# Patient Record
Sex: Female | Born: 1945 | Race: Black or African American | Hispanic: No | Marital: Married | Smoking: Never smoker
Health system: Southern US, Community
[De-identification: ages and names within clinical notes are randomized; demographics above are authoritative.]

## PROBLEM LIST (undated history)

## (undated) DIAGNOSIS — K219 Gastro-esophageal reflux disease without esophagitis: Secondary | ICD-10-CM

## (undated) DIAGNOSIS — I10 Essential (primary) hypertension: Secondary | ICD-10-CM

## (undated) DIAGNOSIS — I2510 Atherosclerotic heart disease of native coronary artery without angina pectoris: Secondary | ICD-10-CM

## (undated) DIAGNOSIS — I493 Ventricular premature depolarization: Secondary | ICD-10-CM

## (undated) DIAGNOSIS — E785 Hyperlipidemia, unspecified: Secondary | ICD-10-CM

## (undated) HISTORY — PX: KNEE ARTHROSCOPY: SUR90

## (undated) HISTORY — PX: ABDOMINAL HYSTERECTOMY: SHX81

## (undated) SURGERY — LEFT HEART CATH AND CORONARY ANGIOGRAPHY
Anesthesia: Moderate Sedation

---

## 2004-06-27 ENCOUNTER — Ambulatory Visit: Payer: Self-pay | Admitting: Unknown Physician Specialty

## 2004-07-06 ENCOUNTER — Ambulatory Visit: Payer: Self-pay | Admitting: Endocrinology

## 2005-01-23 ENCOUNTER — Ambulatory Visit: Payer: Self-pay | Admitting: General Practice

## 2006-01-23 ENCOUNTER — Ambulatory Visit: Payer: Self-pay | Admitting: General Practice

## 2007-01-29 ENCOUNTER — Ambulatory Visit: Payer: Self-pay | Admitting: Endocrinology

## 2007-09-02 ENCOUNTER — Ambulatory Visit: Payer: Self-pay | Admitting: Gastroenterology

## 2008-03-10 ENCOUNTER — Ambulatory Visit: Payer: Self-pay | Admitting: Internal Medicine

## 2008-04-25 ENCOUNTER — Ambulatory Visit: Payer: Self-pay | Admitting: Unknown Physician Specialty

## 2009-05-26 ENCOUNTER — Ambulatory Visit: Payer: Self-pay | Admitting: Internal Medicine

## 2009-06-02 ENCOUNTER — Ambulatory Visit: Payer: Self-pay | Admitting: Internal Medicine

## 2010-06-09 ENCOUNTER — Ambulatory Visit: Payer: Self-pay | Admitting: Internal Medicine

## 2011-07-11 ENCOUNTER — Ambulatory Visit: Payer: Self-pay | Admitting: Internal Medicine

## 2011-08-15 HISTORY — PX: BREAST BIOPSY: SHX20

## 2012-06-24 ENCOUNTER — Emergency Department: Payer: Self-pay | Admitting: Emergency Medicine

## 2012-06-24 LAB — CBC
HCT: 36.3 % (ref 35.0–47.0)
HGB: 12 g/dL (ref 12.0–16.0)
MCH: 28.6 pg (ref 26.0–34.0)
MCHC: 33 g/dL (ref 32.0–36.0)
RDW: 14.2 % (ref 11.5–14.5)
WBC: 4.5 10*3/uL (ref 3.6–11.0)

## 2012-06-24 LAB — BASIC METABOLIC PANEL
Anion Gap: 7 (ref 7–16)
BUN: 9 mg/dL (ref 7–18)
Co2: 25 mmol/L (ref 21–32)
Creatinine: 0.92 mg/dL (ref 0.60–1.30)
EGFR (African American): >60
EGFR (Non-African Amer.): >60
Glucose: 105 mg/dL — ABNORMAL HIGH (ref 65–99)
Sodium: 140 mmol/L (ref 136–145)

## 2012-06-24 LAB — CK TOTAL AND CKMB (NOT AT ARMC): CK-MB: 3 ng/mL (ref 0.5–3.6)

## 2012-06-24 LAB — TROPONIN I: Troponin-I: <0.02 ng/mL

## 2012-07-15 ENCOUNTER — Ambulatory Visit: Payer: Self-pay | Admitting: Internal Medicine

## 2012-07-16 ENCOUNTER — Ambulatory Visit: Payer: Self-pay | Admitting: Internal Medicine

## 2012-07-22 ENCOUNTER — Ambulatory Visit: Payer: Self-pay | Admitting: Internal Medicine

## 2012-08-05 ENCOUNTER — Ambulatory Visit: Payer: Self-pay | Admitting: Surgery

## 2012-10-18 ENCOUNTER — Ambulatory Visit: Payer: Self-pay | Admitting: Gastroenterology

## 2013-01-14 ENCOUNTER — Ambulatory Visit: Payer: Self-pay | Admitting: Surgery

## 2013-02-05 ENCOUNTER — Ambulatory Visit: Payer: Self-pay | Admitting: Surgery

## 2014-01-14 ENCOUNTER — Ambulatory Visit: Payer: Self-pay | Admitting: Internal Medicine

## 2014-12-23 ENCOUNTER — Other Ambulatory Visit: Payer: Self-pay | Admitting: Internal Medicine

## 2014-12-23 DIAGNOSIS — Z1231 Encounter for screening mammogram for malignant neoplasm of breast: Secondary | ICD-10-CM

## 2015-01-21 ENCOUNTER — Ambulatory Visit
Admission: RE | Admit: 2015-01-21 | Discharge: 2015-01-21 | Disposition: A | Discharge status: Home / Self Care | Payer: Commercial Managed Care - HMO | Source: Ambulatory Visit | Attending: Internal Medicine | Admitting: Internal Medicine

## 2015-01-21 ENCOUNTER — Other Ambulatory Visit: Payer: Self-pay | Admitting: Internal Medicine

## 2015-01-21 DIAGNOSIS — Z1231 Encounter for screening mammogram for malignant neoplasm of breast: Secondary | ICD-10-CM | POA: Diagnosis not present

## 2015-12-08 ENCOUNTER — Other Ambulatory Visit: Payer: Self-pay | Admitting: Internal Medicine

## 2015-12-08 DIAGNOSIS — I6523 Occlusion and stenosis of bilateral carotid arteries: Secondary | ICD-10-CM

## 2015-12-15 ENCOUNTER — Ambulatory Visit
Admission: RE | Admit: 2015-12-15 | Discharge: 2015-12-15 | Disposition: A | Discharge status: Home / Self Care | Payer: Commercial Managed Care - HMO | Source: Ambulatory Visit | Attending: Internal Medicine | Admitting: Internal Medicine

## 2015-12-15 ENCOUNTER — Other Ambulatory Visit: Payer: Self-pay | Admitting: Internal Medicine

## 2015-12-15 DIAGNOSIS — I6523 Occlusion and stenosis of bilateral carotid arteries: Secondary | ICD-10-CM

## 2015-12-15 DIAGNOSIS — E785 Hyperlipidemia, unspecified: Secondary | ICD-10-CM | POA: Diagnosis present

## 2016-02-02 ENCOUNTER — Other Ambulatory Visit: Payer: Self-pay | Admitting: Internal Medicine

## 2016-02-02 DIAGNOSIS — Z1231 Encounter for screening mammogram for malignant neoplasm of breast: Secondary | ICD-10-CM

## 2016-02-22 ENCOUNTER — Ambulatory Visit: Payer: Commercial Managed Care - HMO

## 2016-03-07 ENCOUNTER — Ambulatory Visit: Payer: Commercial Managed Care - HMO

## 2016-03-28 ENCOUNTER — Ambulatory Visit
Admission: RE | Admit: 2016-03-28 | Discharge: 2016-03-28 | Disposition: A | Discharge status: Home / Self Care | Payer: Commercial Managed Care - HMO | Source: Ambulatory Visit | Attending: Internal Medicine | Admitting: Internal Medicine

## 2016-03-28 ENCOUNTER — Other Ambulatory Visit: Payer: Self-pay | Admitting: Internal Medicine

## 2016-03-28 DIAGNOSIS — Z1231 Encounter for screening mammogram for malignant neoplasm of breast: Secondary | ICD-10-CM

## 2016-12-09 ENCOUNTER — Encounter: Payer: Self-pay | Admitting: Emergency Medicine

## 2016-12-09 DIAGNOSIS — R1013 Epigastric pain: Secondary | ICD-10-CM | POA: Diagnosis not present

## 2016-12-09 DIAGNOSIS — I10 Essential (primary) hypertension: Secondary | ICD-10-CM | POA: Insufficient documentation

## 2016-12-09 LAB — URINALYSIS, COMPLETE (UACMP) WITH MICROSCOPIC
BACTERIA UA: NONE SEEN
BILIRUBIN URINE: NEGATIVE
Glucose, UA: NEGATIVE mg/dL
HGB URINE DIPSTICK: NEGATIVE
Ketones, ur: NEGATIVE mg/dL
NITRITE: NEGATIVE
PROTEIN: NEGATIVE mg/dL
Specific Gravity, Urine: 1.015 (ref 1.005–1.030)
pH: 7 (ref 5.0–8.0)

## 2016-12-09 LAB — COMPREHENSIVE METABOLIC PANEL
ALT: 22 U/L (ref 14–54)
ANION GAP: 8 (ref 5–15)
AST: 38 U/L (ref 15–41)
Albumin: 4.1 g/dL (ref 3.5–5.0)
Alkaline Phosphatase: 115 U/L (ref 38–126)
BILIRUBIN TOTAL: 0.6 mg/dL (ref 0.3–1.2)
BUN: 12 mg/dL (ref 6–20)
CO2: 28 mmol/L (ref 22–32)
Calcium: 9.1 mg/dL (ref 8.9–10.3)
Chloride: 101 mmol/L (ref 101–111)
Creatinine, Ser: 1.01 mg/dL — ABNORMAL HIGH (ref 0.44–1.00)
GFR calc Af Amer: >60 mL/min (ref >60)
GFR, EST NON AFRICAN AMERICAN: 54 mL/min — AB (ref >60)
GLUCOSE: 103 mg/dL — AB (ref 65–99)
POTASSIUM: 3.5 mmol/L (ref 3.5–5.1)
Sodium: 137 mmol/L (ref 135–145)
TOTAL PROTEIN: 7.9 g/dL (ref 6.5–8.1)

## 2016-12-09 LAB — CBC
HEMATOCRIT: 38.6 % (ref 35.0–47.0)
HEMOGLOBIN: 12.6 g/dL (ref 12.0–16.0)
MCH: 28.3 pg (ref 26.0–34.0)
MCHC: 32.7 g/dL (ref 32.0–36.0)
MCV: 86.7 fL (ref 80.0–100.0)
Platelets: 230 10*3/uL (ref 150–440)
RBC: 4.45 MIL/uL (ref 3.80–5.20)
RDW: 14.1 % (ref 11.5–14.5)
WBC: 4.7 10*3/uL (ref 3.6–11.0)

## 2016-12-09 LAB — LIPASE, BLOOD: Lipase: 19 U/L (ref 11–51)

## 2016-12-09 LAB — TROPONIN I

## 2016-12-09 NOTE — ED Triage Notes (Signed)
Pt ambulatory to triage in NAD, report epigastric pain today, described as pressure and bloating, denies n/v.

## 2016-12-10 ENCOUNTER — Emergency Department
Admission: EM | Admit: 2016-12-10 | Discharge: 2016-12-10 | Disposition: A | Discharge status: Home / Self Care | Payer: Medicare HMO | Attending: Emergency Medicine | Admitting: Emergency Medicine

## 2016-12-10 DIAGNOSIS — R1013 Epigastric pain: Secondary | ICD-10-CM

## 2016-12-10 DIAGNOSIS — I10 Essential (primary) hypertension: Secondary | ICD-10-CM

## 2016-12-10 MED ORDER — GI COCKTAIL ~~LOC~~
30.0000 mL | Freq: Once | ORAL | Status: AC
  Administered 2016-12-10: 30 mL via ORAL

## 2016-12-10 MED ORDER — GI COCKTAIL ~~LOC~~
ORAL | Status: AC
  Administered 2016-12-10: 30 mL via ORAL
  Filled 2016-12-10: qty 30

## 2016-12-10 NOTE — ED Provider Notes (Signed)
[facility] Emergency Department Provider Note  ____________________________________________   First MD Initiated Contact with Patient 12/10/16 [phone]     (approximate)  I have reviewed the triage vital signs and the nursing notes.   HISTORY  Chief Complaint Abdominal Pain    HPI Beth Lin is a 71 y.o. female with a generally unremarkable medical history except for "occasional" acid reflux/GERD who presents for evaluation of epigastric pressure and bloating since earlier today.  The symptoms started about 12 hours ago after eating lunch.  She says this happens to her sometimes and her primary care doctor, with whom she had a normal and reassuring annual checkup yesterday, has told her she should take a daily acid reflux pill.  However she does not do so because she does not have symptoms all the time.  She took a Protonix after developing the symptoms but she knows that it does not work immediately.  She describes the discomfort as pressure and bloating right at the top of her stomach below her rib cage.  She denies fever/chills, chest pain, shortness of breath, nausea, vomiting, lower abdominal pain, dysuria, constipation, diarrhea.  Nothing in particular makes symptoms better nor worse.  She is feeling better now but still has mild symptoms.     History reviewed. No pertinent past medical history.  There are no active problems to display for this patient.   Past Surgical History:  Procedure Laterality Date  . BREAST BIOPSY Right 2013   Negative    Prior to Admission medications   Not on File    Allergies Tetracyclines & related  Family History  Problem Relation Age of Onset  . Breast cancer Paternal Aunt 44    Social History Social History  Substance Use Topics  . Smoking status: Never Smoker  . Smokeless tobacco: Never Used  . Alcohol use No    Review of Systems Constitutional: No fever/chills Eyes: No visual changes. ENT: No  sore throat. Cardiovascular: Denies chest pain. Respiratory: Denies shortness of breath. Gastrointestinal: Epigastric bloating and pressure, no burning pain.  No nausea, no vomiting.  No diarrhea.  No constipation. Genitourinary: Negative for dysuria. Musculoskeletal: Negative for back pain. Integumentary: Negative for rash. Neurological: Negative for headaches, focal weakness or numbness.   ____________________________________________   PHYSICAL EXAM:  VITAL SIGNS: ED Triage Vitals  Enc Vitals Group     BP 12/09/16 2225 (!) 178/81     Pulse Rate 12/09/16 2225 (!) 54     Resp 12/09/16 2225 20     Temp --      Temp src --      SpO2 12/09/16 2225 98 %     Weight 12/09/16 1957 162 lb (73.5 kg)     Height 12/09/16 1957 4\' 11"  (1.499 m)     Head Circumference --      Peak Flow --      Pain Score 12/09/16 1956 8     Pain Loc --      Pain Edu? --      Excl. in [place]? --     Constitutional: Alert and oriented. Well appearing and in no acute distress. Eyes: Conjunctivae are normal. PERRL. EOMI. Head: Atraumatic. Nose: No congestion/rhinnorhea. Mouth/Throat: Mucous membranes are moist. Neck: No stridor.  No meningeal signs.   Cardiovascular: Normal rate, regular rhythm. Good peripheral circulation. Grossly normal heart sounds. Respiratory: Normal respiratory effort.  No retractions. Lungs CTAB. Gastrointestinal: Soft with very mild tenderness to palpation.  Negative Murphy's sign,  no lower abdominal tenderness to palpation, no rebound, no guarding Musculoskeletal: No lower extremity tenderness nor edema. No gross deformities of extremities. Neurologic:  Normal speech and language. No gross focal neurologic deficits are appreciated.  Skin:  Skin is warm, dry and intact. No rash noted. Psychiatric: Mood and affect are normal. Speech and behavior are normal.  ____________________________________________   LABS (all labs ordered are listed, but only abnormal results are  displayed)  Labs Reviewed  COMPREHENSIVE METABOLIC PANEL - Abnormal; Notable for the following:       Result Value   Glucose, Bld 103 (*)    Creatinine, Ser 1.01 (*)    GFR calc non Af Amer 54 (*)    All other components within normal limits  URINALYSIS, COMPLETE (UACMP) WITH MICROSCOPIC - Abnormal; Notable for the following:    Color, Urine YELLOW (*)    APPearance CLEAR (*)    Leukocytes, UA TRACE (*)    Squamous Epithelial / LPF 0-5 (*)    All other components within normal limits  LIPASE, BLOOD  CBC  TROPONIN I   ____________________________________________  EKG  ED ECG REPORT I, Lakishia Bourassa, the attending physician, personally viewed and interpreted this ECG.  Date: 12/09/2016 EKG Time: 19:56 Rate: 58 Rhythm: Borderline sinus bradycardia QRS Axis: normal Intervals: normal ST/T Wave abnormalities: inverted T wave in lead V2 Conduction Disturbances: none Narrative Interpretation: unremarkable  ____________________________________________  RADIOLOGY   No results found.  ____________________________________________   PROCEDURES  Critical Care performed: No   Procedure(s) performed:   Procedures   ____________________________________________   INITIAL IMPRESSION / ASSESSMENT AND PLAN / ED COURSE  Pertinent labs & imaging results that were available during my care of the patient were reviewed by me and considered in my medical decision making (see chart for details).  The patient is very well-appearing.  Her symptoms have improved significantly during the 4-5 hours that she has been awaiting a room.   Her labs are reassuring.  She does suffer from intermittent GERD.  I encouraged her to continue taking her PPI regularly and follow-up with Dr. Sabra Heck but there is no suggestion that she is suffering from ACS, PE, or other acute or emergent medical condition.  For comfort we will give her a GI cocktail.  She feels comfortable with the plan for outpatient  follow-up.  Clinical Course as of Dec 11 214  Sun Dec 10, 2016  0144 Patient feels better after the GI cocktail.  She is having some elevated blood pressure readings that are significantly elevated but she is completely asymptomatic.  She was seen by her primary care doctor yesterday and they did not discuss any blood pressure medicine.  I had my typical asymptomatic hypertension discussion with the patient and explained that she should follow-up as an outpatient but there was not an indication to start medication tonight.  She understands and agrees.  I gave my usual and customary return precautions.     [CF]    Clinical Course User Index [CF] Hinda Kehr, MD    ____________________________________________  FINAL CLINICAL IMPRESSION(S) / ED DIAGNOSES  Final diagnoses:  Epigastric pain  Essential hypertension     MEDICATIONS GIVEN DURING THIS VISIT:  Medications  gi cocktail (Maalox,Lidocaine,Donnatal) (30 mLs Oral Given 12/10/16 0107)     NEW OUTPATIENT MEDICATIONS STARTED DURING THIS VISIT:  There are no discharge medications for this patient.   There are no discharge medications for this patient.   There are no discharge medications for this patient.  Note:  This document was prepared using Dragon voice recognition software and may include unintentional dictation errors.    Hinda Kehr, MD 12/10/16 [phone]

## 2016-12-10 NOTE — Discharge Instructions (Signed)
We believe your symptoms are a result of GERD (acid reflux).  Please read through the included information and follow up with your regular doctor.  In the meantime, we encourage you to try an over-the-counter medication such as Prilosec OTC.  Give it at least a week at see if your symptoms improve.  As we discussed, though you do have high blood pressure (hypertension) tonight in the ED, fortunately it is not immediately dangerous at this time and does not need emergency intervention or admission to the hospital.  If we add to or change your regular medications, we may cause more harm than good - it is more appropriate for your primary care doctor to evaluate you in clinic and decide if any medication changes are needed.  Please follow up in clinic as recommended in these papers.    Return to the Emergency Department (ED) if you experience any worsening chest pain/pressure/tightness, difficulty breathing, or sudden sweating, or other symptoms that concern you.

## 2017-03-02 ENCOUNTER — Other Ambulatory Visit: Payer: Self-pay | Admitting: Internal Medicine

## 2017-03-02 DIAGNOSIS — Z1231 Encounter for screening mammogram for malignant neoplasm of breast: Secondary | ICD-10-CM

## 2017-03-29 ENCOUNTER — Ambulatory Visit
Admission: RE | Admit: 2017-03-29 | Discharge: 2017-03-29 | Disposition: A | Discharge status: Home / Self Care | Payer: Medicare HMO | Source: Ambulatory Visit | Attending: Internal Medicine | Admitting: Internal Medicine

## 2017-03-29 DIAGNOSIS — Z1231 Encounter for screening mammogram for malignant neoplasm of breast: Secondary | ICD-10-CM | POA: Diagnosis present

## 2017-05-08 IMAGING — MG MM DIGITAL SCREENING BILAT W/ TOMO W/ CAD
9 of 13 series · 9 of 29 positions shown · non-contrast
Comparison: Previous exam(s).

CLINICAL DATA: Screening.

EXAM:
2D DIGITAL SCREENING BILATERAL MAMMOGRAM WITH CAD AND ADJUNCT TOMO

[R CC (1 of 2)]
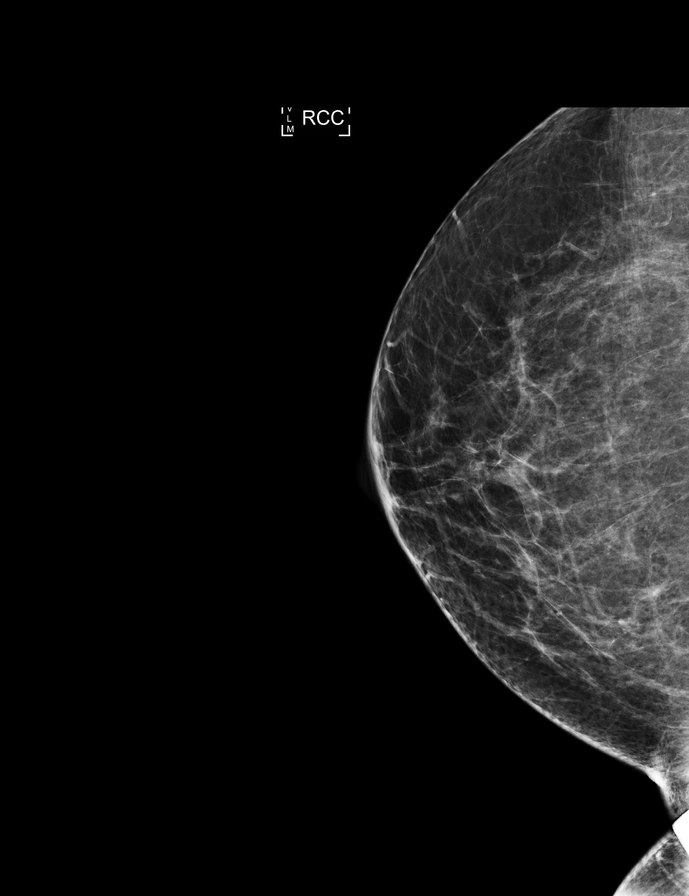

[R MLO]
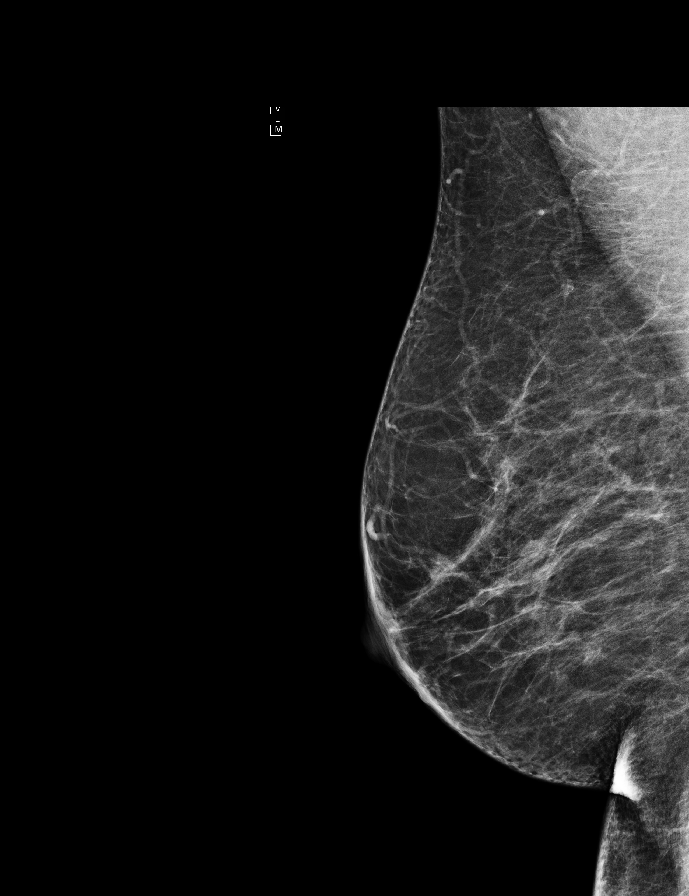

[L CC]
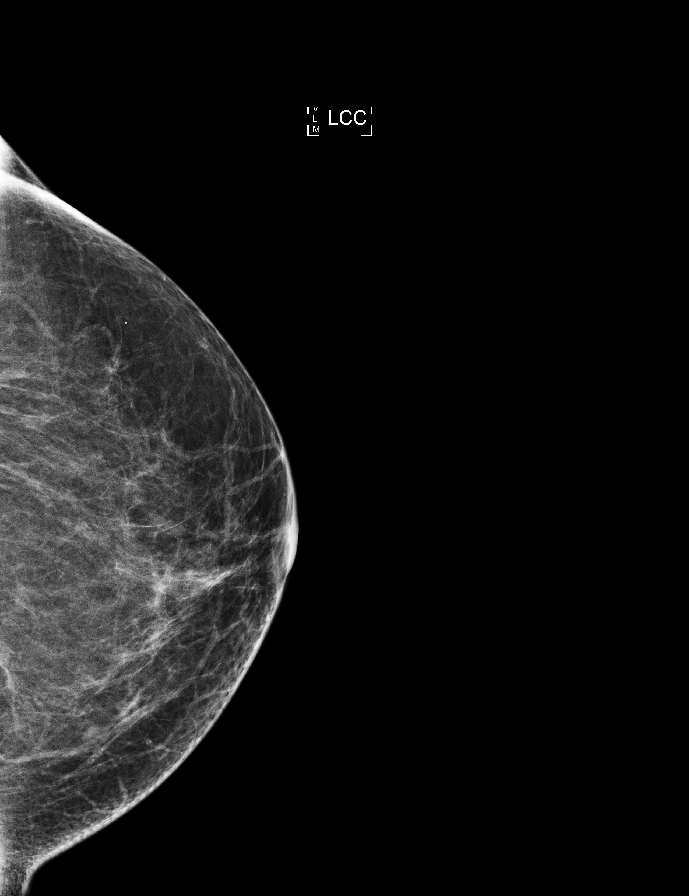

[L MLO]
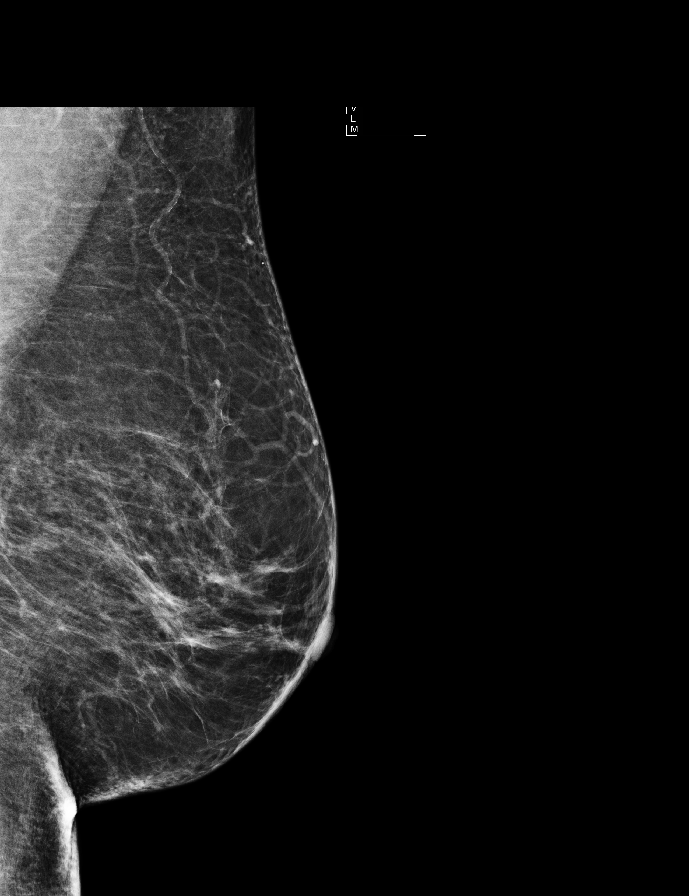

[L MLO synth-2D]
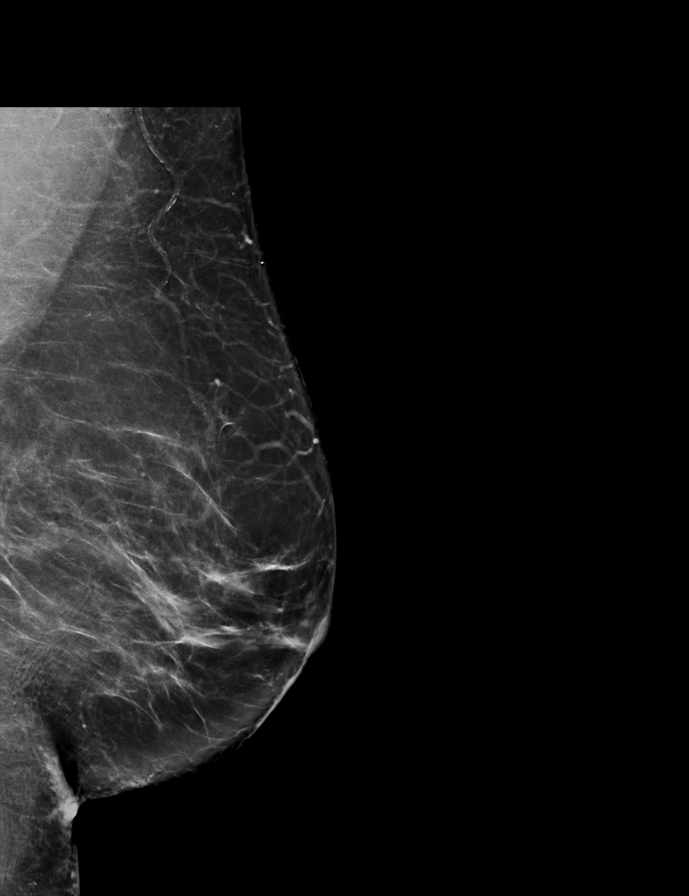

[L CC synth-2D]
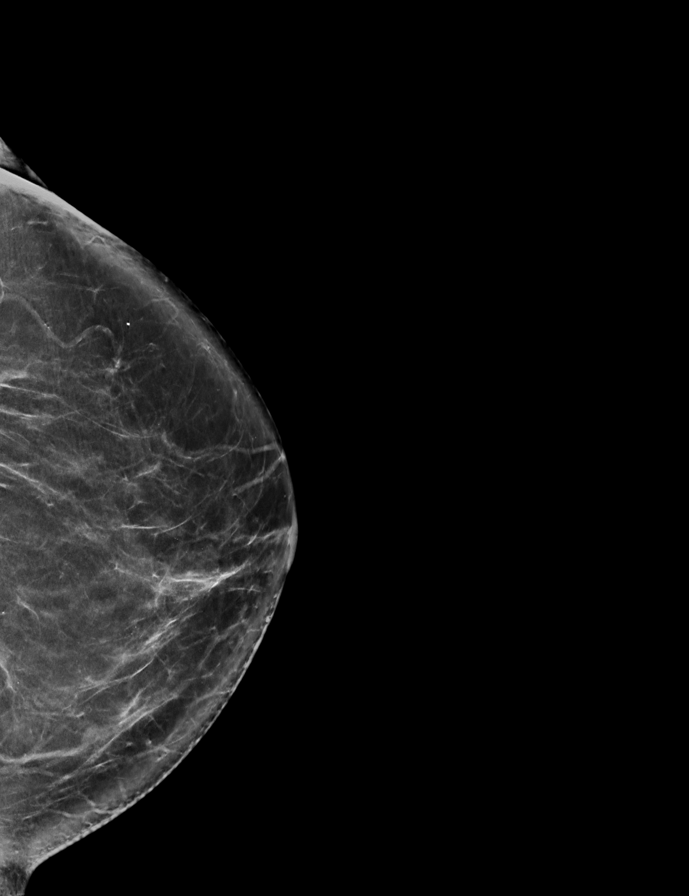

[R CC (2 of 2)]
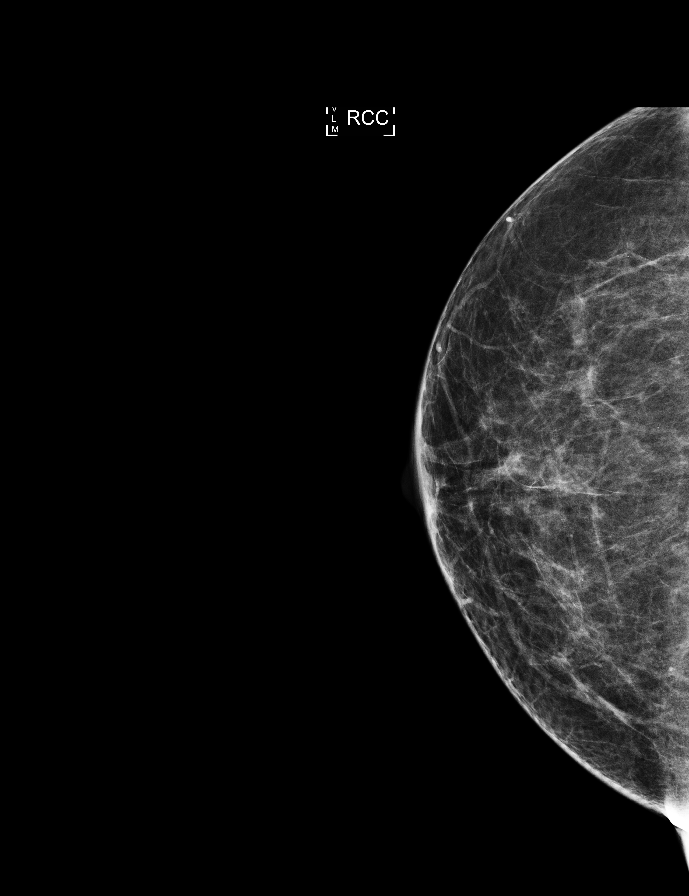

[R CC synth-2D]
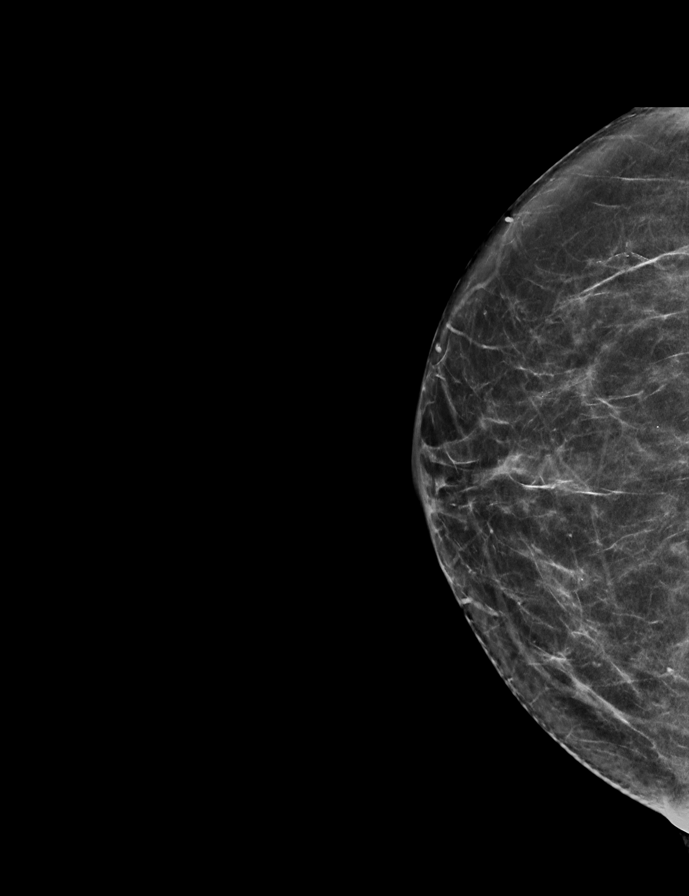

[R MLO synth-2D]
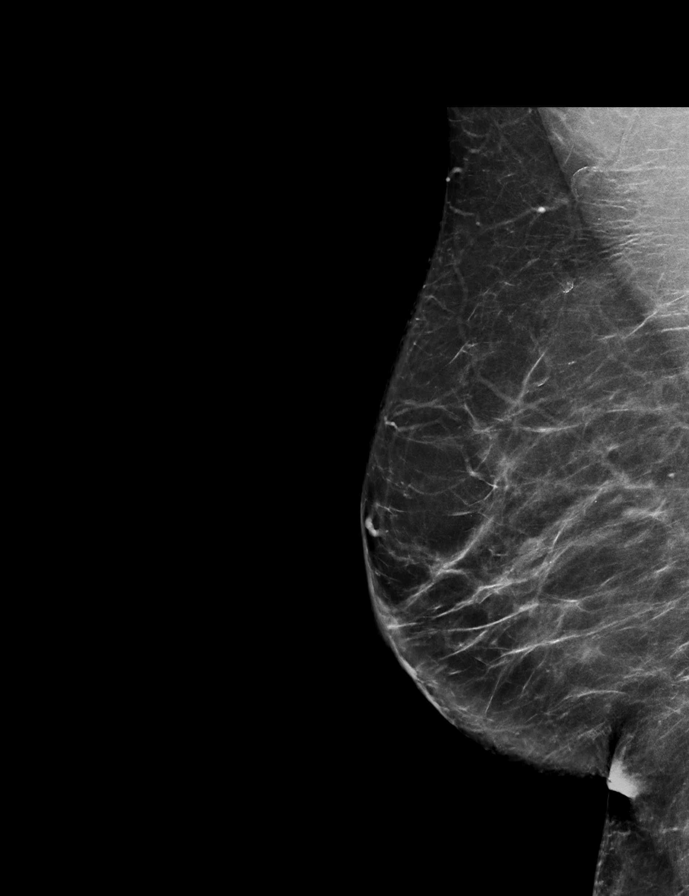

[9 of 29 positions shown; findings below may reference images not displayed]

ACR Breast Density Category b: There are scattered areas of
fibroglandular density.
FINDINGS: There are no findings suspicious for malignancy. Images were
processed with CAD.
IMPRESSION: No mammographic evidence of malignancy. A result letter of this
screening mammogram will be mailed directly to the patient.

RECOMMENDATION:
Screening mammogram in one year. (Code:97-6-RS4)

BI-RADS CATEGORY  1: Negative.

## 2017-12-10 ENCOUNTER — Inpatient Hospital Stay
Admission: EM | Admit: 2017-12-10 | Discharge: 2017-12-12 | DRG: 282 | Disposition: A | Discharge status: Home / Self Care | Payer: Medicare HMO | Attending: Internal Medicine | Admitting: Internal Medicine

## 2017-12-10 ENCOUNTER — Emergency Department: Payer: Medicare HMO

## 2017-12-10 ENCOUNTER — Other Ambulatory Visit: Payer: Self-pay

## 2017-12-10 ENCOUNTER — Encounter: Payer: Self-pay | Admitting: Emergency Medicine

## 2017-12-10 DIAGNOSIS — I2510 Atherosclerotic heart disease of native coronary artery without angina pectoris: Secondary | ICD-10-CM | POA: Diagnosis present

## 2017-12-10 DIAGNOSIS — I4510 Unspecified right bundle-branch block: Secondary | ICD-10-CM | POA: Diagnosis present

## 2017-12-10 DIAGNOSIS — Z79899 Other long term (current) drug therapy: Secondary | ICD-10-CM | POA: Diagnosis not present

## 2017-12-10 DIAGNOSIS — K219 Gastro-esophageal reflux disease without esophagitis: Secondary | ICD-10-CM | POA: Diagnosis present

## 2017-12-10 DIAGNOSIS — E785 Hyperlipidemia, unspecified: Secondary | ICD-10-CM | POA: Diagnosis present

## 2017-12-10 DIAGNOSIS — I10 Essential (primary) hypertension: Secondary | ICD-10-CM | POA: Diagnosis present

## 2017-12-10 DIAGNOSIS — I214 Non-ST elevation (NSTEMI) myocardial infarction: Principal | ICD-10-CM

## 2017-12-10 HISTORY — DX: Essential (primary) hypertension: I10

## 2017-12-10 HISTORY — DX: Gastro-esophageal reflux disease without esophagitis: K21.9

## 2017-12-10 LAB — COMPREHENSIVE METABOLIC PANEL
ALBUMIN: 4 g/dL (ref 3.5–5.0)
ALK PHOS: 97 U/L (ref 38–126)
ALT: 14 U/L (ref 14–54)
ANION GAP: 5 (ref 5–15)
AST: 29 U/L (ref 15–41)
BILIRUBIN TOTAL: 0.6 mg/dL (ref 0.3–1.2)
BUN: 10 mg/dL (ref 6–20)
CALCIUM: 8.8 mg/dL — AB (ref 8.9–10.3)
CO2: 28 mmol/L (ref 22–32)
Chloride: 105 mmol/L (ref 101–111)
Creatinine, Ser: 0.97 mg/dL (ref 0.44–1.00)
GFR calc Af Amer: >60 mL/min (ref >60)
GFR calc non Af Amer: 57 mL/min — ABNORMAL LOW (ref >60)
GLUCOSE: 115 mg/dL — AB (ref 65–99)
Potassium: 3.5 mmol/L (ref 3.5–5.1)
Sodium: 138 mmol/L (ref 135–145)
TOTAL PROTEIN: 7.9 g/dL (ref 6.5–8.1)

## 2017-12-10 LAB — CBC
HEMATOCRIT: 39.4 % (ref 35.0–47.0)
Hemoglobin: 13 g/dL (ref 12.0–16.0)
MCH: 29.1 pg (ref 26.0–34.0)
MCHC: 33 g/dL (ref 32.0–36.0)
MCV: 88.1 fL (ref 80.0–100.0)
Platelets: 248 10*3/uL (ref 150–440)
RBC: 4.47 MIL/uL (ref 3.80–5.20)
RDW: 14.2 % (ref 11.5–14.5)
WBC: 4.6 10*3/uL (ref 3.6–11.0)

## 2017-12-10 LAB — TROPONIN I
Troponin I: 0.84 ng/mL (ref <0.03)
Troponin I: 0.69 ng/mL (ref <0.03)
Troponin I: 0.69 ng/mL (ref <0.03)

## 2017-12-10 LAB — HEPARIN LEVEL (UNFRACTIONATED): Heparin Unfractionated: 0.41 IU/mL (ref 0.30–0.70)

## 2017-12-10 LAB — TSH: TSH: 3.391 u[IU]/mL (ref 0.350–4.500)

## 2017-12-10 LAB — PROTIME-INR
INR: 1
Prothrombin Time: 13.1 seconds (ref 11.4–15.2)

## 2017-12-10 LAB — APTT: APTT: 31 s (ref 24–36)

## 2017-12-10 MED ORDER — SODIUM CHLORIDE 0.9 % WEIGHT BASED INFUSION: 3.0000 mL/kg/h | INTRAVENOUS | Status: AC

## 2017-12-10 MED ORDER — SIMVASTATIN 20 MG PO TABS
40.0000 mg | ORAL_TABLET | Freq: Every day | ORAL | Status: DC
  Administered 2017-12-11: 40 mg via ORAL
  Filled 2017-12-10: qty 2

## 2017-12-10 MED ORDER — NITROGLYCERIN 0.4 MG SL SUBL
0.4000 mg | SUBLINGUAL_TABLET | SUBLINGUAL | Status: DC | PRN
  Administered 2017-12-11: 0.4 mg via SUBLINGUAL
  Filled 2017-12-10: qty 1

## 2017-12-10 MED ORDER — HEPARIN (PORCINE) IN NACL 100-0.45 UNIT/ML-% IJ SOLN
750.0000 [IU]/h | INTRAMUSCULAR | Status: DC
  Administered 2017-12-10: 750 [IU]/h via INTRAVENOUS
  Filled 2017-12-10: qty 250

## 2017-12-10 MED ORDER — ASPIRIN 81 MG PO CHEW: 324.0000 mg | CHEWABLE_TABLET | ORAL | Status: AC

## 2017-12-10 MED ORDER — ASPIRIN 300 MG RE SUPP: 300.0000 mg | RECTAL | Status: AC

## 2017-12-10 MED ORDER — SODIUM CHLORIDE 0.9% FLUSH: 3.0000 mL | INTRAVENOUS | Status: DC | PRN

## 2017-12-10 MED ORDER — ASPIRIN 81 MG PO CHEW
324.0000 mg | CHEWABLE_TABLET | Freq: Once | ORAL | Status: AC
  Administered 2017-12-10: 324 mg via ORAL
  Filled 2017-12-10: qty 4

## 2017-12-10 MED ORDER — ASPIRIN EC 325 MG PO TBEC
325.0000 mg | DELAYED_RELEASE_TABLET | Freq: Every day | ORAL | Status: DC
  Administered 2017-12-12: 325 mg via ORAL
  Filled 2017-12-10: qty 1

## 2017-12-10 MED ORDER — SODIUM CHLORIDE 0.9 % WEIGHT BASED INFUSION
1.0000 mL/kg/h | INTRAVENOUS | Status: DC
  Administered 2017-12-10 – 2017-12-11 (×2): 1 mL/kg/h via INTRAVENOUS

## 2017-12-10 MED ORDER — HEPARIN BOLUS VIA INFUSION
3700.0000 [IU] | Freq: Once | INTRAVENOUS | Status: AC
  Administered 2017-12-10: 3700 [IU] via INTRAVENOUS
  Filled 2017-12-10: qty 3700

## 2017-12-10 MED ORDER — PANTOPRAZOLE SODIUM 40 MG PO TBEC
40.0000 mg | DELAYED_RELEASE_TABLET | Freq: Every day | ORAL | Status: DC
  Administered 2017-12-10 – 2017-12-12 (×3): 40 mg via ORAL
  Filled 2017-12-10 (×3): qty 1

## 2017-12-10 MED ORDER — NITROGLYCERIN 2 % TD OINT
0.5000 [in_us] | TOPICAL_OINTMENT | Freq: Four times a day (QID) | TRANSDERMAL | Status: DC
  Administered 2017-12-11 (×2): 0.5 [in_us] via TOPICAL
  Filled 2017-12-10 (×2): qty 1

## 2017-12-10 MED ORDER — ASPIRIN EC 325 MG PO TBEC
325.0000 mg | DELAYED_RELEASE_TABLET | Freq: Every day | ORAL | Status: DC
  Administered 2017-12-11: 325 mg via ORAL
  Filled 2017-12-10: qty 1

## 2017-12-10 MED ORDER — ASPIRIN EC 81 MG PO TBEC: 243.0000 mg | DELAYED_RELEASE_TABLET | Freq: Once | ORAL | Status: DC

## 2017-12-10 MED ORDER — LISINOPRIL 5 MG PO TABS
2.5000 mg | ORAL_TABLET | Freq: Every day | ORAL | Status: DC
  Administered 2017-12-10 – 2017-12-12 (×3): 2.5 mg via ORAL
  Filled 2017-12-10 (×3): qty 1

## 2017-12-10 MED ORDER — ASPIRIN 81 MG PO CHEW
81.0000 mg | CHEWABLE_TABLET | ORAL | Status: AC
Start: 2017-12-11 — End: 2017-12-11
  Administered 2017-12-11: 81 mg via ORAL
  Filled 2017-12-10: qty 1

## 2017-12-10 MED ORDER — ALPRAZOLAM 0.25 MG PO TABS: 0.2500 mg | ORAL_TABLET | Freq: Two times a day (BID) | ORAL | Status: DC | PRN

## 2017-12-10 MED ORDER — HYDRALAZINE HCL 20 MG/ML IJ SOLN: 10.0000 mg | INTRAMUSCULAR | Status: DC | PRN

## 2017-12-10 MED ORDER — METOPROLOL TARTRATE 25 MG PO TABS
12.5000 mg | ORAL_TABLET | Freq: Two times a day (BID) | ORAL | Status: DC
  Administered 2017-12-10 – 2017-12-12 (×4): 12.5 mg via ORAL
  Filled 2017-12-10 (×4): qty 1

## 2017-12-10 MED ORDER — ONDANSETRON HCL 4 MG/2ML IJ SOLN
4.0000 mg | Freq: Four times a day (QID) | INTRAMUSCULAR | Status: DC | PRN
Start: 2017-12-10 — End: 2017-12-12
  Administered 2017-12-11: 4 mg via INTRAVENOUS

## 2017-12-10 MED ORDER — ACETAMINOPHEN 325 MG PO TABS
650.0000 mg | ORAL_TABLET | ORAL | Status: DC | PRN
  Filled 2017-12-10: qty 2

## 2017-12-10 MED ORDER — SODIUM CHLORIDE 0.9% FLUSH: 3.0000 mL | Freq: Two times a day (BID) | INTRAVENOUS | Status: DC

## 2017-12-10 MED ORDER — SODIUM CHLORIDE 0.9 % IV SOLN: 250.0000 mL | INTRAVENOUS | Status: DC | PRN

## 2017-12-10 MED ORDER — NITROGLYCERIN 2 % TD OINT: 0.5000 [in_us] | TOPICAL_OINTMENT | Freq: Four times a day (QID) | TRANSDERMAL | Status: DC

## 2017-12-10 NOTE — ED Provider Notes (Addendum)
[facility] Emergency Department Provider Note ____________________________________________   First MD Initiated Contact with Patient 12/10/17 1319     (approximate)  I have reviewed the triage vital signs and the nursing notes.   HISTORY  Chief Complaint Weakness    HPI Beth Lin is a 72 y.o. female with PMH as noted below as well as a history of GERD who presents with fatigue since yesterday, acute onset, occurring mainly with exertion, and associated with some pain to her mid back.  She denies chest pain, difficulty breathing, lightheadedness or weakness, or other acute symptoms.  She went to her primary care doctor's office today.  He noted that she had an abnormal EKG and referred her to the emergency department.  Patient states she has no prior cardiac history.  History reviewed. No pertinent past medical history.  There are no active problems to display for this patient.   Past Surgical History:  Procedure Laterality Date  . BREAST BIOPSY Right 2013   Negative    Prior to Admission medications   Not on File    Allergies Penicillins and Tetracyclines & related  Family History  Problem Relation Age of Onset  . Breast cancer Paternal Aunt 45    Social History Social History   Tobacco Use  . Smoking status: Never Smoker  . Smokeless tobacco: Never Used  Substance Use Topics  . Alcohol use: No  . Drug use: Not on file    Review of Systems  Constitutional: Positive for fatigue.  No fever. Eyes: No redness. ENT: No neck pain. Cardiovascular: Denies chest pain. Respiratory: Denies shortness of breath. Gastrointestinal: No nausea, no vomiting.  Genitourinary: Negative for flank pain.  Musculoskeletal: Positive for mild back pain. Skin: Negative for rash. Neurological: Negative for headache.   ____________________________________________   PHYSICAL EXAM:  VITAL SIGNS: ED Triage Vitals [12/10/17 1223]  Enc Vitals  Group     BP 124/81     Pulse Rate 74     Resp 18     Temp 98.3 F (36.8 C)     Temp Source Oral     SpO2 98 %     Weight 151 lb (68.5 kg)     Height 5\' 1"  (1.549 m)     Head Circumference      Peak Flow      Pain Score 0     Pain Loc      Pain Edu?      Excl. in [place]?     Constitutional: Alert and oriented. Well appearing and in no acute distress. Eyes: Conjunctivae are normal.  Head: Atraumatic. Nose: No congestion/rhinnorhea. Mouth/Throat: Mucous membranes are moist.   Neck: Normal range of motion.  Cardiovascular: Normal rate, regular rhythm. Grossly normal heart sounds.  Good peripheral circulation. Respiratory: Normal respiratory effort.  No retractions. Lungs CTAB. Gastrointestinal: No distention.  Genitourinary: No CVA tenderness. Musculoskeletal: No lower extremity edema.  Extremities warm and well perfused.  Neurologic:  Normal speech and language. No gross focal neurologic deficits are appreciated.  Skin:  Skin is warm and dry. No rash noted. Psychiatric: Mood and affect are normal. Speech and behavior are normal.  ____________________________________________   LABS (all labs ordered are listed, but only abnormal results are displayed)  Labs Reviewed  TROPONIN I - Abnormal; Notable for the following components:      Result Value   Troponin I 0.84 (*)    All other components within normal limits  COMPREHENSIVE METABOLIC PANEL -  Abnormal; Notable for the following components:   Glucose, Bld 115 (*)    Calcium 8.8 (*)    GFR calc non Af Amer 57 (*)    All other components within normal limits  CBC   ____________________________________________  EKG  ED ECG REPORT I, Arta Silence, the attending physician, personally viewed and interpreted this ECG.  Date: 12/10/2017 EKG Time: 1217 Rate: 75 Rhythm: normal sinus rhythm QRS Axis: normal Intervals: normal ST/T Wave abnormalities: Inferior and lateral T wave inversions Narrative Interpretation:  New inferior and lateral T wave inversions when compared to EKG of 12/10/2017, concerning for acute ischemia  ____________________________________________  RADIOLOGY  CXR: No focal infiltrate or other acute findings  ____________________________________________   PROCEDURES  Procedure(s) performed: No  Procedures  Critical Care performed: Yes  CRITICAL CARE Performed by: Arta Silence   Total critical care time: 20 minutes  Critical care time was exclusive of separately billable procedures and treating other patients.  Critical care was necessary to treat or prevent imminent or life-threatening deterioration.  Critical care was time spent personally by me on the following activities: development of treatment plan with patient and/or surrogate as well as nursing, discussions with consultants, evaluation of patient's response to treatment, examination of patient, obtaining history from patient or surrogate, ordering and performing treatments and interventions, ordering and review of laboratory studies, ordering and review of radiographic studies, pulse oximetry and re-evaluation of patient's condition.  ____________________________________________   INITIAL IMPRESSION / ASSESSMENT AND PLAN / ED COURSE  Pertinent labs & imaging results that were available during my care of the patient were reviewed by me and considered in my medical decision making (see chart for details).  72 year old female with PMH as noted above and no prior cardiac history presents with some fatigue and weakness since last night, with no chest pain or shortness of breath.  She went to her primary care doctor's office today and was noted to have new EKG abnormalities.  She was referred to the emergency department.  I reviewed the past medical records in epic; patient had an episode of epigastric discomfort almost exactly 1 year ago and was seen in the ED and had negative work-up.  On exam, she is  well-appearing, vital signs are normal, and the remainder of the exam is unremarkable.  EKG does show new inferior and lateral T wave inversions.  Labs obtained from triage reveal elevated troponin.  Presentation is concerning for acute ischemia.  Patient does not meet STEMI criteria.  I will consult cardiology, give aspirin, initiate heparin drip, and admit.    ----------------------------------------- 1:53 PM on 12/10/2017 -----------------------------------------  I consulted Dr. Saralyn Pilar from cardiology.  He agrees with the current plan of aspirin and heparin drip.  He will follow the patient.  I signed the patient out for admission to the hospitalist Dr. Jerelyn Charles.  ____________________________________________   FINAL CLINICAL IMPRESSION(S) / ED DIAGNOSES  Final diagnoses:  Non-STEMI (non-ST elevated myocardial infarction) ([place])      NEW MEDICATIONS STARTED DURING THIS VISIT:  New Prescriptions   No medications on file     Note:  This document was prepared using Dragon voice recognition software and may include unintentional dictation errors.   Arta Silence, MD 12/10/17 1354  Arta Silence, MD 12/26/17 1414

## 2017-12-10 NOTE — Progress Notes (Signed)
Attempted to call to receive report, RN was busy at the moment.

## 2017-12-10 NOTE — Consult Note (Signed)
[facility]  CARDIOLOGY CONSULT NOTE  Patient ID: Beth Lin MRN: [id number] DOB/AGE: Jan 07, 1946 72 y.o.  Admit date: 12/10/2017 Referring Physician Salary Primary Physician [facility] Primary Cardiologist  Reason for Consultation non-ST elevation myocardial infarction  HPI: 72 year old female referred for evaluation of non-ST elevation myocardial infarction.  The patient saw her primary care provider early today with apparent routine visit at which time she complained of generalized fatigue and left-sided chest and flank discomfort.  CT revealed new inferolateral ST inversions.  The patient was sent to [facility] emergency room initial troponin was less than 0.03.  Follow-up troponin was 0.84.  Review of systems complete and found to be negative unless listed above     History reviewed. No pertinent past medical history.  Past Surgical History:  Procedure Laterality Date  . ABDOMINAL HYSTERECTOMY    . BREAST BIOPSY Right 2013   Negative     (Not in a hospital admission) Social History   Socioeconomic History  . Marital status: Married    Spouse name: Not on file  . Number of children: Not on file  . Years of education: Not on file  . Highest education level: Not on file  Occupational History  . Not on file  Social Needs  . Financial resource strain: Not on file  . Food insecurity:    Worry: Not on file    Inability: Not on file  . Transportation needs:    Medical: Not on file    Non-medical: Not on file  Tobacco Use  . Smoking status: Never Smoker  . Smokeless tobacco: Never Used  Substance and Sexual Activity  . Alcohol use: No  . Drug use: Not on file  . Sexual activity: Not on file  Lifestyle  . Physical activity:    Days per week: Not on file    Minutes per session: Not on file  . Stress: Not on file  Relationships  . Social connections:    Talks on phone: Not on file    Gets together: Not on file    Attends religious service: Not on file    Active member  of club or organization: Not on file    Attends meetings of clubs or organizations: Not on file    Relationship status: Not on file  . Intimate partner violence:    Fear of current or ex partner: Not on file    Emotionally abused: Not on file    Physically abused: Not on file    Forced sexual activity: Not on file  Other Topics Concern  . Not on file  Social History Narrative  . Not on file    Family History  Problem Relation Age of Onset  . Breast cancer Paternal Aunt 61      Review of systems complete and found to be negative unless listed above      PHYSICAL EXAM  General: Well developed, well nourished, in no acute distress HEENT:  Normocephalic and atramatic Neck:  No JVD.  Lungs: Clear bilaterally to auscultation and percussion. Heart: HRRR . Normal S1 and S2 without gallops or murmurs.  Abdomen: Bowel sounds are positive, abdomen soft and non-tender  Msk:  Back normal, normal gait. Normal strength and tone for age. Extremities: No clubbing, cyanosis or edema.   Neuro: Alert and oriented X 3. Psych:  Good affect, responds appropriately  Labs:   Lab Results  Component Value Date   WBC 4.6 12/10/2017   HGB 13.0 12/10/2017   HCT 39.4  12/10/2017   MCV 88.1 12/10/2017   PLT 248 12/10/2017   Recent Labs  Lab 12/10/17 1225  NA 138  K 3.5  CL 105  CO2 28  BUN 10  CREATININE 0.97  CALCIUM 8.8*  PROT 7.9  BILITOT 0.6  ALKPHOS 97  ALT 14  AST 29  GLUCOSE 115*   Lab Results  Component Value Date   CKTOTAL 232 (H) 06/24/2012   CKMB 3.0 06/24/2012   TROPONINI 0.84 (HH) 12/10/2017   No results found for: CHOL No results found for: HDL No results found for: LDLCALC No results found for: TRIG No results found for: CHOLHDL No results found for: LDLDIRECT    Radiology: Dg Chest 2 View  Result Date: 12/10/2017 CLINICAL DATA:  Weakness EXAM: CHEST - 2 VIEW COMPARISON:  06/25/2012 FINDINGS: The heart size and mediastinal contours are within normal limits.  Both lungs are clear. The visualized skeletal structures are unremarkable. IMPRESSION: No active cardiopulmonary disease. Electronically Signed   By: Franchot Gallo M.D.   On: 12/10/2017 13:28    EKG: Normal sinus rhythm with inferolateral T wave inversions  ASSESSMENT AND PLAN:   1.  New onset chest pain, with atypical features, new ECG changes, with elevated troponin, consistent with non-ST elevation myocardial infarction  Recommendations  1.  Continue current medication 2.  Continue heparin drip 3.  Proceed with cardiac catheterization via right radial access with coronary angiography on 12/11/2017.  The risks, benefits alternatives of cardiac catheterization and potential percutaneous coronary intervention were explained to the patient and informed consent was obtained.  Signed: Isaias Cowman MD,PhD, [facility] 12/10/2017, 4:43 PM

## 2017-12-10 NOTE — Progress Notes (Signed)
[place] for Heparin Indication: chest pain/ACS  Allergies  Allergen Reactions  . Penicillins Swelling  . Tetracyclines & Related     Patient Measurements: Height: 5\' 1"  (154.9 cm) Weight: 151 lb (68.5 kg) IBW/kg (Calculated) : 47.8 Heparin Dosing Weight: 62 kg  Vital Signs: Temp: 98.3 F (36.8 C) (04/29 1223) Temp Source: Oral (04/29 1223) BP: 129/83 (04/29 1347) Pulse Rate: 71 (04/29 1347)  Labs: Recent Labs    12/10/17 1225  HGB 13.0  HCT 39.4  PLT 248  CREATININE 0.97  TROPONINI 0.84*    Estimated Creatinine Clearance: 45.7 mL/min (by C-G formula based on SCr of 0.97 mg/dL).   Medical History: History reviewed. No pertinent past medical history.  Assessment: 72 y/o F non on anticoagulants PTA per patient admitted with ACS.   Goal of Therapy:  Heparin level 0.3-0.7 units/ml Monitor platelets by anticoagulation protocol: Yes   Plan:  Give 3700 units bolus x 1 Start heparin infusion at 750 units/hr Check anti-Xa level in 8 hours and daily while on heparin Continue to monitor H&H and platelets  Ulice Dash D 12/10/2017,2:03 PM

## 2017-12-10 NOTE — ED Triage Notes (Signed)
Fatigue when gets up to walk since yesterday. Denies chest pain or SOB.

## 2017-12-10 NOTE — ED Notes (Signed)
Admitting MD at bedside.

## 2017-12-10 NOTE — ED Notes (Signed)
Attempted to call report

## 2017-12-10 NOTE — H&P (Signed)
[facility] [place] NAME: Beth Lin    MR#:  [id number]  DATE OF BIRTH:  03-23-1946  DATE OF ADMISSION:  12/10/2017  PRIMARY CARE PHYSICIAN: Rusty Aus, MD   REQUESTING/REFERRING PHYSICIAN:   CHIEF COMPLAINT:   Chief Complaint  Patient presents with  . Weakness    HISTORY OF PRESENT ILLNESS: Beth Lin  is a 72 y.o. female with a known history of GERD, hypertension, hyperlipidemia, presents with fatigue to starting on yesterday with walking/exertion, associated with mid back pain, weakness, seen by primary care provider earlier today-noted to have abnormal EKG, sent to the emergency room for further evaluation, ER work-up noted for troponin of 0.84, chest x-ray negative, EKG noted for T wave inversions inferiorly/laterally, right bundle branch block, case discussed between ED attending and cardiology/Dr. Josefa Half, hospitalist asked to admit, patient evaluated emergency room, husband at the bedside, patient in no apparent distress, resting comfortably in bed, patient now admitted for acute non-STEMI.  PAST MEDICAL HISTORY:   htn gerd hld  PAST SURGICAL HISTORY:  Past Surgical History:  Procedure Laterality Date  . BREAST BIOPSY Right 2013   Negative    SOCIAL HISTORY:  Social History   Tobacco Use  . Smoking status: Never Smoker  . Smokeless tobacco: Never Used  Substance Use Topics  . Alcohol use: No    FAMILY HISTORY:  Family History  Problem Relation Age of Onset  . Breast cancer Paternal Aunt 62    DRUG ALLERGIES:  Allergies  Allergen Reactions  . Penicillins Swelling  . Tetracyclines & Related     REVIEW OF SYSTEMS:   CONSTITUTIONAL: No fever, +fatigue, weakness with exertion.  EYES: No blurred or double vision.  EARS, NOSE, AND THROAT: No tinnitus or ear pain.  RESPIRATORY: No cough, shortness of breath, wheezing or hemoptysis.  CARDIOVASCULAR: No chest pain, orthopnea, edema.  GASTROINTESTINAL: No  nausea, vomiting, diarrhea or abdominal pain.  GENITOURINARY: No dysuria, hematuria.  ENDOCRINE: No polyuria, nocturia,  HEMATOLOGY: No anemia, easy bruising or bleeding SKIN: No rash or lesion. MUSCULOSKELETAL: No joint pain or arthritis. + Mid back pain NEUROLOGIC: No tingling, numbness, weakness.  PSYCHIATRY: No anxiety or depression.   MEDICATIONS AT HOME:  Prior to Admission medications   Medication Sig Start Date End Date Taking? Authorizing Provider  hydrochlorothiazide (HYDRODIURIL) 25 MG tablet Take 1 tablet by mouth daily as needed (blood pressure).    Yes [provider]  loratadine (CLARITIN) 10 MG tablet Take 10 mg by mouth daily.   Yes [provider]  montelukast (SINGULAIR) 10 MG tablet Take 1 tablet by mouth daily as needed (allergies or asthma).  12/08/16  Yes [provider]  pantoprazole (PROTONIX) 40 MG tablet Take 1 tablet by mouth daily. 12/08/16  Yes [provider]      PHYSICAL EXAMINATION:   VITAL SIGNS: Blood pressure 129/83, pulse 71, temperature 98.3 F (36.8 C), temperature source Oral, resp. rate 15, height 5\' 1"  (1.549 m), weight 68.5 kg (151 lb), SpO2 98 %.  GENERAL:  72 y.o.-year-old patient lying in the bed with no acute distress.  EYES: Pupils equal, round, reactive to light and accommodation. No scleral icterus. Extraocular muscles intact.  HEENT: Head atraumatic, normocephalic. Oropharynx and nasopharynx clear.  NECK:  Supple, no jugular venous distention. No thyroid enlargement, no tenderness.  LUNGS: Normal breath sounds bilaterally, no wheezing, rales,rhonchi or crepitation. No use of accessory muscles of respiration.  CARDIOVASCULAR: S1, S2 normal. No murmurs,  rubs, or gallops.  ABDOMEN: Soft, nontender, nondistended. Bowel sounds present. No organomegaly or mass.  EXTREMITIES: No pedal edema, cyanosis, or clubbing.  NEUROLOGIC: Cranial nerves II through XII are intact. Muscle strength 5/5 in all extremities.  Sensation intact. Gait not checked.  PSYCHIATRIC: The patient is alert and oriented x 3.  SKIN: No obvious rash, lesion, or ulcer.   LABORATORY PANEL:   CBC Recent Labs  Lab 12/10/17 1225  WBC 4.6  HGB 13.0  HCT 39.4  PLT 248  MCV 88.1  MCH 29.1  MCHC 33.0  RDW 14.2   ------------------------------------------------------------------------------------------------------------------  Chemistries  Recent Labs  Lab 12/10/17 1225  NA 138  K 3.5  CL 105  CO2 28  GLUCOSE 115*  BUN 10  CREATININE 0.97  CALCIUM 8.8*  AST 29  ALT 14  ALKPHOS 97  BILITOT 0.6   ------------------------------------------------------------------------------------------------------------------ estimated creatinine clearance is 45.7 mL/min (by C-G formula based on SCr of 0.97 mg/dL). ------------------------------------------------------------------------------------------------------------------ No results for input(s): TSH, T4TOTAL, T3FREE, THYROIDAB in the last 72 hours.  Invalid input(s): FREET3   Coagulation profile No results for input(s): INR, PROTIME in the last 168 hours. ------------------------------------------------------------------------------------------------------------------- No results for input(s): DDIMER in the last 72 hours. -------------------------------------------------------------------------------------------------------------------  Cardiac Enzymes Recent Labs  Lab 12/10/17 1225  TROPONINI 0.84*   ------------------------------------------------------------------------------------------------------------------ Invalid input(s): POCBNP  ---------------------------------------------------------------------------------------------------------------  Urinalysis    Component Value Date/Time   COLORURINE YELLOW (A) 12/09/2016 1958   APPEARANCEUR CLEAR (A) 12/09/2016 1958   LABSPEC 1.015 12/09/2016 1958   PHURINE 7.0 12/09/2016 1958   GLUCOSEU NEGATIVE  12/09/2016 1958   HGBUR NEGATIVE 12/09/2016 1958   BILIRUBINUR NEGATIVE 12/09/2016 1958   KETONESUR NEGATIVE 12/09/2016 1958   PROTEINUR NEGATIVE 12/09/2016 1958   NITRITE NEGATIVE 12/09/2016 1958   LEUKOCYTESUR TRACE (A) 12/09/2016 1958     RADIOLOGY: Dg Chest 2 View  Result Date: 12/10/2017 CLINICAL DATA:  Weakness EXAM: CHEST - 2 VIEW COMPARISON:  06/25/2012 FINDINGS: The heart size and mediastinal contours are within normal limits. Both lungs are clear. The visualized skeletal structures are unremarkable. IMPRESSION: No active cardiopulmonary disease. Electronically Signed   By: Franchot Gallo M.D.   On: 12/10/2017 13:28    EKG: Orders placed or performed during the hospital encounter of 12/10/17  . EKG 12-Lead  . EKG 12-Lead  . ED EKG within 10 minutes  . ED EKG within 10 minutes    IMPRESSION AND PLAN: *Acute non-STEMI *Chronic GERD without esophagitis *Chronic hyperlipidemia, unspecified *Chronic benign essential hypertension   Admit to telemetry floor on her ACS protocol, heparin drip for 48 hours, aspirin daily, Lopressor twice daily, lisinopril, schedule nitro paste to chest, supplemental oxygen as needed, IV morphine as needed breakthrough pain, echocardiogram, consult cardiology for expert opinion, continue to cycle cardiac enzymes, check lipids in the morning, PPI daily, vitals per routine, and continue close medical monitoring   All the records are reviewed and case discussed with ED provider. Management plans discussed with the patient, family and they are in agreement.  CODE STATUS:full Advance Directive Documentation     Most Recent Value  Type of Advance Directive  Healthcare Power of Attorney, Living will  Pre-existing out of facility DNR order (yellow form or pink MOST form)  -  "MOST" Form in Place?  -       TOTAL TIME TAKING CARE OF THIS PATIENT: 45 minutes.    Avel Peace Evaline Waltman M.D on 12/10/2017   Between 7am to 6pm - Pager -  [phone]  After 6pm  go to [email or web address] - password [email or web address] [facility]  Office  [phone]  CC: Primary care physician; Rusty Aus, MD   Note: This dictation was prepared with Dragon dictation along with smaller phrase technology. Any transcriptional errors that result from this process are unintentional.

## 2017-12-11 ENCOUNTER — Encounter
Admission: EM | Disposition: A | Discharge status: Home / Self Care | Payer: Self-pay | Source: Home / Self Care | Attending: Internal Medicine

## 2017-12-11 ENCOUNTER — Encounter: Payer: Self-pay | Admitting: *Deleted

## 2017-12-11 ENCOUNTER — Inpatient Hospital Stay
Admit: 2017-12-11 | Discharge: 2017-12-11 | Disposition: A | Discharge status: Home / Self Care | Payer: Medicare HMO | Attending: Family Medicine | Admitting: Family Medicine

## 2017-12-11 HISTORY — PX: LEFT HEART CATH AND CORONARY ANGIOGRAPHY: CATH118249

## 2017-12-11 LAB — LIPID PANEL
Cholesterol: 262 mg/dL — ABNORMAL HIGH (ref 0–200)
HDL: 51 mg/dL (ref >40)
LDL CALC: 191 mg/dL — AB (ref 0–99)
Total CHOL/HDL Ratio: 5.1 RATIO
Triglycerides: 102 mg/dL (ref <150)
VLDL: 20 mg/dL (ref 0–40)

## 2017-12-11 LAB — CBC
HCT: 39 % (ref 35.0–47.0)
Hemoglobin: 12.7 g/dL (ref 12.0–16.0)
MCH: 28.7 pg (ref 26.0–34.0)
MCHC: 32.5 g/dL (ref 32.0–36.0)
MCV: 88.4 fL (ref 80.0–100.0)
Platelets: 226 10*3/uL (ref 150–440)
RBC: 4.42 MIL/uL (ref 3.80–5.20)
RDW: 14.3 % (ref 11.5–14.5)
WBC: 4.9 10*3/uL (ref 3.6–11.0)

## 2017-12-11 LAB — HEPARIN LEVEL (UNFRACTIONATED): HEPARIN UNFRACTIONATED: 0.34 [IU]/mL (ref 0.30–0.70)

## 2017-12-11 SURGERY — LEFT HEART CATH AND CORONARY ANGIOGRAPHY
Anesthesia: Moderate Sedation

## 2017-12-11 MED ORDER — SODIUM CHLORIDE 0.9 % WEIGHT BASED INFUSION: 1.0000 mL/kg/h | INTRAVENOUS | Status: DC

## 2017-12-11 MED ORDER — FENTANYL CITRATE (PF) 100 MCG/2ML IJ SOLN
INTRAMUSCULAR | Status: AC
  Filled 2017-12-11: qty 2

## 2017-12-11 MED ORDER — OXYCODONE-ACETAMINOPHEN 5-325 MG PO TABS
1.0000 | ORAL_TABLET | Freq: Four times a day (QID) | ORAL | Status: DC | PRN
  Administered 2017-12-11: 1 via ORAL
  Filled 2017-12-11 (×2): qty 1

## 2017-12-11 MED ORDER — LIDOCAINE HCL (PF) 1 % IJ SOLN
INTRAMUSCULAR | Status: AC
  Filled 2017-12-11: qty 30

## 2017-12-11 MED ORDER — ATROPINE SULFATE 1 MG/10ML IJ SOSY
PREFILLED_SYRINGE | INTRAMUSCULAR | Status: AC
  Filled 2017-12-11: qty 10

## 2017-12-11 MED ORDER — ASPIRIN 81 MG PO CHEW: 81.0000 mg | CHEWABLE_TABLET | ORAL | Status: DC

## 2017-12-11 MED ORDER — VERAPAMIL HCL 2.5 MG/ML IV SOLN
INTRAVENOUS | Status: AC
Start: 2017-12-11 — End: 2017-12-11
  Filled 2017-12-11: qty 2

## 2017-12-11 MED ORDER — SODIUM CHLORIDE 0.9% FLUSH: 3.0000 mL | Freq: Two times a day (BID) | INTRAVENOUS | Status: DC

## 2017-12-11 MED ORDER — VERAPAMIL HCL 2.5 MG/ML IV SOLN
INTRAVENOUS | Status: AC
  Filled 2017-12-11: qty 2

## 2017-12-11 MED ORDER — SODIUM CHLORIDE 0.9 % WEIGHT BASED INFUSION
1.0000 mL/kg/h | INTRAVENOUS | Status: AC
  Administered 2017-12-11: 1 mL/kg/h via INTRAVENOUS

## 2017-12-11 MED ORDER — SODIUM CHLORIDE 0.9% FLUSH: 3.0000 mL | INTRAVENOUS | Status: DC | PRN

## 2017-12-11 MED ORDER — ONDANSETRON HCL 4 MG/2ML IJ SOLN
4.0000 mg | Freq: Four times a day (QID) | INTRAMUSCULAR | Status: DC | PRN
  Filled 2017-12-11: qty 2

## 2017-12-11 MED ORDER — ATROPINE SULFATE 1 MG/10ML IJ SOSY
PREFILLED_SYRINGE | INTRAMUSCULAR | Status: DC | PRN
  Administered 2017-12-11: 0.5 mg via INTRAVENOUS

## 2017-12-11 MED ORDER — MIDAZOLAM HCL 2 MG/2ML IJ SOLN
INTRAMUSCULAR | Status: AC
  Filled 2017-12-11: qty 2

## 2017-12-11 MED ORDER — SALINE SPRAY 0.65 % NA SOLN
1.0000 | NASAL | Status: DC | PRN
  Administered 2017-12-12: 1 via NASAL
  Filled 2017-12-11: qty 44

## 2017-12-11 MED ORDER — SODIUM CHLORIDE 0.9 % WEIGHT BASED INFUSION: 3.0000 mL/kg/h | INTRAVENOUS | Status: AC

## 2017-12-11 MED ORDER — SODIUM CHLORIDE 0.9 % IV SOLN: 250.0000 mL | INTRAVENOUS | Status: DC | PRN

## 2017-12-11 MED ORDER — HEPARIN (PORCINE) IN NACL 1000-0.9 UT/500ML-% IV SOLN
INTRAVENOUS | Status: AC
  Filled 2017-12-11: qty 1000

## 2017-12-11 MED ORDER — MIDAZOLAM HCL 2 MG/2ML IJ SOLN
INTRAMUSCULAR | Status: DC | PRN
  Administered 2017-12-11: 1 mg via INTRAVENOUS

## 2017-12-11 MED ORDER — OXYCODONE HCL 5 MG PO TABS
5.0000 mg | ORAL_TABLET | Freq: Four times a day (QID) | ORAL | Status: DC | PRN
  Administered 2017-12-11: 5 mg via ORAL
  Filled 2017-12-11 (×2): qty 1

## 2017-12-11 MED ORDER — IOPAMIDOL (ISOVUE-300) INJECTION 61%
INTRAVENOUS | Status: DC | PRN
  Administered 2017-12-11: 120 mL via INTRA_ARTERIAL

## 2017-12-11 MED ORDER — ACETAMINOPHEN 325 MG PO TABS
650.0000 mg | ORAL_TABLET | ORAL | Status: DC | PRN
  Administered 2017-12-11: 650 mg via ORAL

## 2017-12-11 MED ORDER — LIDOCAINE HCL (PF) 1 % IJ SOLN
INTRAMUSCULAR | Status: DC | PRN
  Administered 2017-12-11: 15 mL
  Administered 2017-12-11: 2 mL

## 2017-12-11 MED ORDER — SODIUM CHLORIDE 0.9% FLUSH
3.0000 mL | INTRAVENOUS | Status: DC | PRN
  Administered 2017-12-11: 3 mL via INTRAVENOUS
  Filled 2017-12-11: qty 3

## 2017-12-11 MED ORDER — FENTANYL CITRATE (PF) 100 MCG/2ML IJ SOLN
INTRAMUSCULAR | Status: DC | PRN
  Administered 2017-12-11: 25 ug via INTRAVENOUS

## 2017-12-11 MED ORDER — HEPARIN SODIUM (PORCINE) 1000 UNIT/ML IJ SOLN
INTRAMUSCULAR | Status: AC
  Filled 2017-12-11: qty 1

## 2017-12-11 SURGICAL SUPPLY — 12 items
CATH INFINITI 5FR ANG PIGTAIL (CATHETERS) ×3 IMPLANT
CATH INFINITI 5FR JL4 (CATHETERS) ×3 IMPLANT
CATH INFINITI JR4 5F (CATHETERS) ×3 IMPLANT
DEVICE CLOSURE MYNXGRIP 5F (Vascular Products) ×3 IMPLANT
KIT MANI 3VAL PERCEP (MISCELLANEOUS) ×3 IMPLANT
NDL PERC 4X21 ACCESS (NEEDLE) ×3 IMPLANT
NEEDLE PERC 18GX7CM (NEEDLE) ×3 IMPLANT
PACK CARDIAC CATH (CUSTOM PROCEDURE TRAY) ×3 IMPLANT
SHEATH AVANTI 5FR X 11CM (SHEATH) ×3 IMPLANT
SHEATH RAIN RADIAL 21G 6FR (SHEATH) ×3 IMPLANT
WIRE GUIDERIGHT .035X150 (WIRE) ×3 IMPLANT
WIRE ROSEN-J .035X260CM (WIRE) IMPLANT

## 2017-12-11 NOTE — Progress Notes (Signed)
[facility] at [place] NAME: Beth Lin    MR#:  [id number]  DATE OF BIRTH:  November 25, 1945  SUBJECTIVE:  came in after feeling weak. She had abdominal EKG noted primary care physician's office. Patient denies any chest pain or shortness of breath. family the room.  REVIEW OF SYSTEMS:   Review of Systems  Constitutional: Negative for chills, fever and weight loss.  HENT: Negative for ear discharge, ear pain and nosebleeds.   Eyes: Negative for blurred vision, pain and discharge.  Respiratory: Negative for sputum production, shortness of breath, wheezing and stridor.   Cardiovascular: Negative for chest pain, palpitations, orthopnea and PND.  Gastrointestinal: Negative for abdominal pain, diarrhea, nausea and vomiting.  Genitourinary: Negative for frequency and urgency.  Musculoskeletal: Negative for back pain and joint pain.  Neurological: Negative for sensory change, speech change, focal weakness and weakness.  Psychiatric/Behavioral: Negative for depression and hallucinations. The patient is not nervous/anxious.    Tolerating Diet:npo Tolerating PT: ambulatory  DRUG ALLERGIES:   Allergies  Allergen Reactions  . Penicillins Swelling  . Tetracyclines & Related     VITALS:  Blood pressure 139/85, pulse 81, temperature 98 F (36.7 C), temperature source Oral, resp. rate (!) 21, height 5\' 1"  (1.549 m), weight 67.2 kg (148 lb 3.2 oz), SpO2 96 %.  PHYSICAL EXAMINATION:   Physical Exam  GENERAL:  72 y.o.-year-old patient lying in the bed with no acute distress.  EYES: Pupils equal, round, reactive to light and accommodation. No scleral icterus. Extraocular muscles intact.  HEENT: Head atraumatic, normocephalic. Oropharynx and nasopharynx clear.  NECK:  Supple, no jugular venous distention. No thyroid enlargement, no tenderness.  LUNGS: Normal breath sounds bilaterally, no wheezing, rales, rhonchi. No use of accessory muscles of  respiration.  CARDIOVASCULAR: S1, S2 normal. No murmurs, rubs, or gallops.  ABDOMEN: Soft, nontender, nondistended. Bowel sounds present. No organomegaly or mass.  EXTREMITIES: No cyanosis, clubbing or edema b/l.    NEUROLOGIC: Cranial nerves II through XII are intact. No focal Motor or sensory deficits b/l.   PSYCHIATRIC:  patient is alert and oriented x 3.  SKIN: No obvious rash, lesion, or ulcer.   LABORATORY PANEL:  CBC Recent Labs  Lab 12/11/17 0505  WBC 4.9  HGB 12.7  HCT 39.0  PLT 226    Chemistries  Recent Labs  Lab 12/10/17 1225  NA 138  K 3.5  CL 105  CO2 28  GLUCOSE 115*  BUN 10  CREATININE 0.97  CALCIUM 8.8*  AST 29  ALT 14  ALKPHOS 97  BILITOT 0.6   Cardiac Enzymes Recent Labs  Lab 12/10/17 2238  TROPONINI 0.69*   RADIOLOGY:  Dg Chest 2 View  Result Date: 12/10/2017 CLINICAL DATA:  Weakness EXAM: CHEST - 2 VIEW COMPARISON:  06/25/2012 FINDINGS: The heart size and mediastinal contours are within normal limits. Both lungs are clear. The visualized skeletal structures are unremarkable. IMPRESSION: No active cardiopulmonary disease. Electronically Signed   By: Franchot Gallo M.D.   On: 12/10/2017 13:28   ASSESSMENT AND PLAN:  Beth Lin  is a 72 y.o. female with a known history of GERD, hypertension, hyperlipidemia, presents with fatigue to starting on yesterday with walking/exertion, associated with mid back pain, weakness, seen by primary care provider earlier today-noted to have abnormal EKG, sent to the emergency room for further evaluation, ER work-up noted for troponin of 0.84, chest x-ray negative, EKG noted for T wave inversions inferiorly/laterally, right bundle branch block  *  Acute non-STEMI -admitted with abnormal EKG as outpatient along with weakness and elevated troponin -IV heparin drip, PRN Nitro, low-dose beta blockers and lisinopril. -She was seen by cardiology. Scheduled for heart catheterization today  *Chronic GERD without  esophagitis -cont ppi  *Chronic hyperlipidemia, unspecified  *Chronic benign essential hypertension Hold HcTZ On BB and lisinopril in the setting of NSTEMi   Case discussed with Care Management/Social Worker. Management plans discussed with the patient, family and they are in agreement.  CODE STATUS: full  DVT Prophylaxis: heparin drip  TOTAL TIME TAKING CARE OF THIS PATIENT: *30* minutes.  >50% time spent on counselling and coordination of care  POSSIBLE D/C IN *1-2* DAYS, DEPENDING ON CLINICAL CONDITION.  Note: This dictation was prepared with Dragon dictation along with smaller phrase technology. Any transcriptional errors that result from this process are unintentional.  Fritzi Mandes M.D on 12/11/2017 at 2:18 PM  Between 7am to 6pm - Pager - [phone]  After 6pm go to [email or web address] - password [email or web address] [facility]  Office  [phone]  CC: Primary care physician; Rusty Aus, MDPatient ID: Beth Lin, female   DOB: [birth year], 72 y.o.   MRN: [id number]

## 2017-12-11 NOTE — Progress Notes (Addendum)
[place] for Heparin Indication: chest pain/ACS  Allergies  Allergen Reactions  . Penicillins Swelling  . Tetracyclines & Related     Patient Measurements: Height: 5\' 1"  (154.9 cm) Weight: 148 lb 3.2 oz (67.2 kg) IBW/kg (Calculated) : 47.8 Heparin Dosing Weight: 62 kg  Vital Signs: Temp: 98 F (36.7 C) (04/29 1945) Temp Source: Oral (04/29 1945) BP: 109/69 (04/30 0031) Pulse Rate: 84 (04/30 0031)  Labs: Recent Labs    12/10/17 1225 12/10/17 1414 12/10/17 1902 12/10/17 2238  HGB 13.0  --   --   --   HCT 39.4  --   --   --   PLT 248  --   --   --   APTT  --  31  --   --   LABPROT  --  13.1  --   --   INR  --  1.00  --   --   HEPARINUNFRC  --   --   --  0.41  CREATININE 0.97  --   --   --   TROPONINI 0.84*  --  0.69* 0.69*    Estimated Creatinine Clearance: 45.3 mL/min (by C-G formula based on SCr of 0.97 mg/dL).   Medical History: History reviewed. No pertinent past medical history.  Assessment: 72 y/o F non on anticoagulants PTA per patient admitted with ACS.   Goal of Therapy:  Heparin level 0.3-0.7 units/ml Monitor platelets by anticoagulation protocol: Yes   Plan:  Give 3700 units bolus x 1 Start heparin infusion at 750 units/hr Check anti-Xa level in 8 hours and daily while on heparin Continue to monitor H&H and platelets   4/29 2300 heparin level 0.41. Continue current regimen. Recheck heparin level and CBC with tomorrow AM labs..  4/30 AM heparin level 0.34. Continue current regimen. Recheck heparin level and CBC with tomorrow AM labs.  Beth Lin S 12/11/2017,12:43 AM

## 2017-12-11 NOTE — Care Management (Signed)
Admitted to [facility] with weakness /NSTEMI. Lives with husband, Tempie Donning [phone]. Sees Dr. [name] as primary care physician. Takes care of all basic and instrumental activities of daily living herself.  Scheduled for Cardiac Catherization today. Troponin on admission = 0.69.  No cardiac history prior to this admission Shelbie Ammons RN MSN CCM care Management [phone]

## 2017-12-12 LAB — CBC
HEMATOCRIT: 34.5 % — AB (ref 35.0–47.0)
HEMOGLOBIN: 11.4 g/dL — AB (ref 12.0–16.0)
MCH: 29.1 pg (ref 26.0–34.0)
MCHC: 33.1 g/dL (ref 32.0–36.0)
MCV: 87.8 fL (ref 80.0–100.0)
Platelets: 219 10*3/uL (ref 150–440)
RBC: 3.93 MIL/uL (ref 3.80–5.20)
RDW: 14.2 % (ref 11.5–14.5)
WBC: 5.2 10*3/uL (ref 3.6–11.0)

## 2017-12-12 LAB — ECHOCARDIOGRAM COMPLETE
HEIGHTINCHES: 61 in
WEIGHTICAEL: 2371.2 [oz_av]

## 2017-12-12 LAB — HEPARIN LEVEL (UNFRACTIONATED)

## 2017-12-12 MED ORDER — LISINOPRIL 2.5 MG PO TABS: 2.5000 mg | ORAL_TABLET | Freq: Every day | ORAL | 1 refills | Status: AC

## 2017-12-12 MED ORDER — METOPROLOL TARTRATE 25 MG PO TABS: 12.5000 mg | ORAL_TABLET | Freq: Two times a day (BID) | ORAL | 1 refills | Status: AC

## 2017-12-12 MED ORDER — SIMVASTATIN 40 MG PO TABS: 40.0000 mg | ORAL_TABLET | Freq: Every day | ORAL | 1 refills | Status: AC

## 2017-12-12 MED ORDER — ASPIRIN 325 MG PO TBEC: 325.0000 mg | DELAYED_RELEASE_TABLET | Freq: Every day | ORAL | 0 refills | Status: AC

## 2017-12-12 NOTE — Progress Notes (Signed)
[place] for Heparin Indication: chest pain/ACS  Allergies  Allergen Reactions  . Penicillins Swelling  . Tetracyclines & Related     Patient Measurements: Height: 5\' 1"  (154.9 cm) Weight: 148 lb 3.2 oz (67.2 kg) IBW/kg (Calculated) : 47.8 Heparin Dosing Weight: 62 kg  Vital Signs: Temp: 97.9 F (36.6 C) (05/01 0410) Temp Source: Oral (04/30 2002) BP: 114/71 (05/01 0410) Pulse Rate: 62 (05/01 0410)  Labs: Recent Labs    12/10/17 1225 12/10/17 1414 12/10/17 1902 12/10/17 2238 12/11/17 0505 12/12/17 0516  HGB 13.0  --   --   --  12.7 11.4*  HCT 39.4  --   --   --  39.0 34.5*  PLT 248  --   --   --  226 219  APTT  --  31  --   --   --   --   LABPROT  --  13.1  --   --   --   --   INR  --  1.00  --   --   --   --   HEPARINUNFRC  --   --   --  0.41 0.34 <0.10*  CREATININE 0.97  --   --   --   --   --   TROPONINI 0.84*  --  0.69* 0.69*  --   --     Estimated Creatinine Clearance: 45.3 mL/min (by C-G formula based on SCr of 0.97 mg/dL).   Medical History: Past Medical History:  Diagnosis Date  . GERD (gastroesophageal reflux disease)   . Hypertension     Assessment: 72 y/o F non on anticoagulants PTA per patient admitted with ACS.   Goal of Therapy:  Heparin level 0.3-0.7 units/ml Monitor platelets by anticoagulation protocol: Yes   Plan:  Give 3700 units bolus x 1 Start heparin infusion at 750 units/hr Check anti-Xa level in 8 hours and daily while on heparin Continue to monitor H&H and platelets   4/29 2300 heparin level 0.41. Continue current regimen. Recheck heparin level and CBC with tomorrow AM labs..  4/30 AM heparin level 0.34. Continue current regimen. Recheck heparin level and CBC with tomorrow AM labs.  5/1 AM heparin level <0.1. Heparin off per RN but order not d/c. Requested that RN ask MD to d/c heparin orders at rounding.  Donnae Michels S 12/12/2017,6:46 AM

## 2017-12-12 NOTE — Discharge Summary (Signed)
[place] at [facility]   PATIENT NAME: Beth Lin    MR#:  [id number]  DATE OF BIRTH:  August 29, 1945  DATE OF ADMISSION:  12/10/2017 ADMITTING PHYSICIAN: Gorden Harms, MD  DATE OF DISCHARGE: 12/12/2017  PRIMARY CARE PHYSICIAN: Rusty Aus, MD    ADMISSION DIAGNOSIS:  Non-STEMI (non-ST elevated myocardial infarction) ([place]) [I21.4]  DISCHARGE DIAGNOSIS:  NSTEMI s/p Cath  SECONDARY DIAGNOSIS:   Past Medical History:  Diagnosis Date  . GERD (gastroesophageal reflux disease)   . Hypertension     HOSPITAL COURSE:   Beth Lin a72 y.o.femalewith a known history of GERD, hypertension, hyperlipidemia, presents with fatigue to starting on yesterday with walking/exertion, associated with mid back pain, weakness, seen by primary care provider earlier today-noted to have abnormal EKG, sent to the emergency room for further evaluation, ER work-up noted for troponin of 0.84, chest x-ray negative, EKG noted for T wave inversions inferiorly/laterally, right bundle branch block  *Acute non-STEMI -admitted with abnormal EKG as outpatient along with weakness and elevated troponin -received IV heparin drip, PRN Nitro, low-dose beta blockers and lisinopril. -She was seen by cardiology. 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  Normal left ventricular function  *Chronic GERD without esophagitis -cont ppi  *Chronic hyperlipidemia, unspecified  *Chronic benign essential hypertension Hold HcTZ On BB and lisinopril in the setting of NSTEMi  Overall stable D/c home CONSULTS OBTAINED:  Treatment Team:  Isaias Cowman, MD  DRUG ALLERGIES:   Allergies  Allergen Reactions  . Penicillins Swelling  . Tetracyclines & Related     DISCHARGE MEDICATIONS:   Allergies as of 12/12/2017      Reactions   Penicillins Swelling   Tetracyclines & Related        Medication List    STOP taking these medications   hydrochlorothiazide 25 MG tablet Commonly known as:  HYDRODIURIL     TAKE these medications   aspirin 325 MG EC tablet Take 1 tablet (325 mg total) by mouth daily.   lisinopril 2.5 MG tablet Commonly known as:  PRINIVIL,ZESTRIL Take 1 tablet (2.5 mg total) by mouth at bedtime.   loratadine 10 MG tablet Commonly known as:  CLARITIN Take 10 mg by mouth daily.   metoprolol tartrate 25 MG tablet Commonly known as:  LOPRESSOR Take 0.5 tablets (12.5 mg total) by mouth 2 (two) times daily.   montelukast 10 MG tablet Commonly known as:  SINGULAIR Take 1 tablet by mouth daily as needed (allergies or asthma).   pantoprazole 40 MG tablet Commonly known as:  PROTONIX Take 1 tablet by mouth daily.   simvastatin 40 MG tablet Commonly known as:  ZOCOR Take 1 tablet (40 mg total) by mouth daily at 6 PM.       If you experience worsening of your admission symptoms, develop shortness of breath, life threatening emergency, suicidal or homicidal thoughts you must seek medical attention immediately by calling 911 or calling your MD immediately  if symptoms less severe.  You Must read complete instructions/literature along with all the possible adverse reactions/side effects for all the Medicines you take and that have been prescribed to you. Take any new Medicines after you have completely understood and accept all the possible adverse reactions/side effects.   Please note  You were cared for by a hospitalist during your hospital stay. If you have any questions about your discharge medications or the care you  received while you were in the hospital after you are discharged, you can call the unit and asked to speak with the hospitalist on call if the hospitalist that took care of you is not available. Once you are discharged, your primary care physician will handle any further medical issues. Please note that NO REFILLS for any  discharge medications will be authorized once you are discharged, as it is imperative that you return to your primary care physician (or establish a relationship with a primary care physician if you do not have one) for your aftercare needs so that they can reassess your need for medications and monitor your lab values. Today   SUBJECTIVE   Doing well. C/o allergies  VITAL SIGNS:  Blood pressure 110/65, pulse (!) 56, temperature 98.3 F (36.8 C), temperature source Oral, resp. rate 17, height 5\' 1"  (1.549 m), weight 67.2 kg (148 lb 3.2 oz), SpO2 98 %.  I/O:    Intake/Output Summary (Last 24 hours) at 12/12/2017 0812 Last data filed at 12/12/2017 0100 Gross per 24 hour  Intake 408 ml  Output -  Net 408 ml    PHYSICAL EXAMINATION:  GENERAL:  72 y.o.-year-old patient lying in the bed with no acute distress.  EYES: Pupils equal, round, reactive to light and accommodation. No scleral icterus. Extraocular muscles intact.  HEENT: Head atraumatic, normocephalic. Oropharynx and nasopharynx clear.  NECK:  Supple, no jugular venous distention. No thyroid enlargement, no tenderness.  LUNGS: Normal breath sounds bilaterally, no wheezing, rales,rhonchi or crepitation. No use of accessory muscles of respiration.  CARDIOVASCULAR: S1, S2 normal. No murmurs, rubs, or gallops.  ABDOMEN: Soft, non-tender, non-distended. Bowel sounds present. No organomegaly or mass.  EXTREMITIES: No pedal edema, cyanosis, or clubbing.  NEUROLOGIC: Cranial nerves II through XII are intact. Muscle strength 5/5 in all extremities. Sensation intact. Gait not checked.  PSYCHIATRIC: The patient is alert and oriented x 3.  SKIN: No obvious rash, lesion, or ulcer.   DATA REVIEW:   CBC  Recent Labs  Lab 12/12/17 0516  WBC 5.2  HGB 11.4*  HCT 34.5*  PLT 219    Chemistries  Recent Labs  Lab 12/10/17 1225  NA 138  K 3.5  CL 105  CO2 28  GLUCOSE 115*  BUN 10  CREATININE 0.97  CALCIUM 8.8*  AST 29  ALT 14   ALKPHOS 97  BILITOT 0.6    Microbiology Results   No results found for this or any previous visit (from the past 240 hour(s)).  RADIOLOGY:  Dg Chest 2 View  Result Date: 12/10/2017 CLINICAL DATA:  Weakness EXAM: CHEST - 2 VIEW COMPARISON:  06/25/2012 FINDINGS: The heart size and mediastinal contours are within normal limits. Both lungs are clear. The visualized skeletal structures are unremarkable. IMPRESSION: No active cardiopulmonary disease. Electronically Signed   By: Franchot Gallo M.D.   On: 12/10/2017 13:28     Management plans discussed with the patient, family and they are in agreement.  CODE STATUS:     Code Status Orders  (From admission, onward)        Start     Ordered   12/10/17 1835  Full code  Continuous     12/10/17 1834    Code Status History    This patient has a current code status but no historical code status.    Advance Directive Documentation     Most Recent Value  Type of Advance Directive  Healthcare Power of Attorney, Living will  Pre-existing out  of facility DNR order (yellow form or pink MOST form)  -  "MOST" Form in Place?  -      TOTAL TIME TAKING CARE OF THIS PATIENT: *40* minutes.    Fritzi Mandes M.D on 12/12/2017 at 8:12 AM  Between 7am to 6pm - Pager - [phone] After 6pm go to [email or web address] - password [email or web address] [facility]  Office  [phone]  CC: Primary care physician; Rusty Aus, MD

## 2017-12-12 NOTE — Progress Notes (Signed)
Cardiovascular and Pulmonary Nurse Navigator Note  Beth Lin a72 y.o.femalewith a known history of GERD, hypertension, hyperlipidemia, presents with fatigue to starting the day prior to presenting with walking/exertion, associated with mid back pain, weakness, seen by primary care provider earlier today-noted to have abnormal EKG, sent to the emergency room for further evaluation, ER work-up noted for troponin of 0.84, chest x-ray negative, EKG noted for T wave inversions inferiorly/laterally, right bundle branch block  *Acute non-STEMI -admitted with abnormal EKG as outpatient along with weakness and elevated troponin -low-dose beta blockers and lisinopril, statin -She was seen by cardiology. 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 Normal left ventricular function  *Chronic GERD without esophagitis  *Chronic hyperlipidemia, unspecified  *Chronic benign essential hypertension On BB and lisinopril in the setting of NSTEMI  Education:   Patient lying in the bed with HOB elevated 40 degrees.  Husband at bedside.  "Heart Attack Bouncing Back" booklet given and reviewed with patient and husband. Discussed the definition of CAD. Reviewed the location of CAD.   ? Discussed modifiable risk factors including controlling blood pressure, cholesterol, and blood sugar; following heart healthy diet; maintaining healthy weight; exercise; and smoking cessation, if applicable. ? ? Discussed cardiac medications including rationale for taking, mechanisms of action, and side effects. Stressed the importance of taking medications as prescribed.  ? Discussed emergency plan for heart attack symptoms. Patient verbalized understanding of need to call 911 and not to drive herself?or have her family drive?her?to the?ER if having cardiac symptoms / chest pain.  ? Heart healthy diet of low sodium, low fat, low  cholesterol heart healthy diet discussed. Information on diet provided.    Smoking Cessation - Patient is a NEVER smoker.    Exercise - Benefits of exercised discussed. Informed patient cardiologist has referred her to outpatient Cardiac Rehab. An overview of the program was provided.?Patient very familiar with Cardiac Rehab, as her husband completed the program.  Patient scheduled for Cardiac Rehab orientation on Thursday, Dec 20, 2017 at 11:30 a.m.    Patient and husband appreciative of the information.  ? Roanna Epley, RN, BSN, [facility]? [facility]  Cardiovascular &?Pulmonary Nurse Navigator  Direct Line: [phone]  Department Phone #: [phone] Fax: [phone]? Email Address: [email or web address] ?

## 2017-12-12 NOTE — Care Management (Signed)
No discharge needs identified by members of the care team 

## 2017-12-20 ENCOUNTER — Ambulatory Visit: Payer: Medicare HMO

## 2018-02-05 ENCOUNTER — Emergency Department: Payer: Medicare HMO

## 2018-02-05 ENCOUNTER — Other Ambulatory Visit: Payer: Self-pay

## 2018-02-05 ENCOUNTER — Emergency Department
Admission: EM | Admit: 2018-02-05 | Discharge: 2018-02-05 | Disposition: A | Discharge status: Home / Self Care | Payer: Medicare HMO | Attending: Emergency Medicine | Admitting: Emergency Medicine

## 2018-02-05 DIAGNOSIS — R079 Chest pain, unspecified: Secondary | ICD-10-CM | POA: Diagnosis present

## 2018-02-05 DIAGNOSIS — I10 Essential (primary) hypertension: Secondary | ICD-10-CM | POA: Diagnosis not present

## 2018-02-05 DIAGNOSIS — Z7982 Long term (current) use of aspirin: Secondary | ICD-10-CM | POA: Insufficient documentation

## 2018-02-05 DIAGNOSIS — Z79899 Other long term (current) drug therapy: Secondary | ICD-10-CM | POA: Insufficient documentation

## 2018-02-05 LAB — CBC
HCT: 37 % (ref 35.0–47.0)
HEMOGLOBIN: 12.4 g/dL (ref 12.0–16.0)
MCH: 29.4 pg (ref 26.0–34.0)
MCHC: 33.6 g/dL (ref 32.0–36.0)
MCV: 87.7 fL (ref 80.0–100.0)
Platelets: 221 10*3/uL (ref 150–440)
RBC: 4.22 MIL/uL (ref 3.80–5.20)
RDW: 14.4 % (ref 11.5–14.5)
WBC: 5.1 10*3/uL (ref 3.6–11.0)

## 2018-02-05 LAB — BASIC METABOLIC PANEL
Anion gap: 9 (ref 5–15)
BUN: 10 mg/dL (ref 8–23)
CO2: 27 mmol/L (ref 22–32)
Calcium: 9.3 mg/dL (ref 8.9–10.3)
Chloride: 102 mmol/L (ref 98–111)
Creatinine, Ser: 0.96 mg/dL (ref 0.44–1.00)
GFR, EST NON AFRICAN AMERICAN: 57 mL/min — AB (ref >60)
GLUCOSE: 109 mg/dL — AB (ref 70–99)
Potassium: 3.6 mmol/L (ref 3.5–5.1)
SODIUM: 138 mmol/L (ref 135–145)

## 2018-02-05 LAB — TROPONIN I

## 2018-02-05 NOTE — ED Triage Notes (Signed)
Pt triage via wheelchair. Pt reports she was awakened from sleep around 1am with epigastric pain that she describes as non radiating and burning. Pt denies n/v or shortness of breath with the pain but reports some diaphoresis. Pt reports she chewed 2 asa and took protonix. Pt also reports she took her HCTZ also. Pt talking in full and complete sentences with no difficulty at this time.

## 2018-02-05 NOTE — ED Provider Notes (Signed)
[facility] Emergency Department Provider Note  Time seen: 7:13 AM  I have reviewed the triage vital signs and the nursing notes.   HISTORY  Chief Complaint Chest Pain    HPI Beth Lin is a 72 y.o. female with a past medical history of gastric reflux, hypertension, presents to the emergency department for heartburn sensation.  According to the patient for the past few days she has been experiencing burning dull type sensation in the upper abdomen and lower chest.  Patient has a long history of gastric reflux, tried taking her gastric reflux medications for this last night and it did not improve it so she became concerned that it could be cardiac.  Took aspirin which she thinks might of made the symptoms little worse and then came to the hospital.  She states however upon arrival to the hospital all of her symptoms have resolved.  Denies any shortness of breath or vomiting at any point.  Denies diaphoresis.   Past Medical History:  Diagnosis Date  . GERD (gastroesophageal reflux disease)   . Hypertension     Patient Active Problem List   Diagnosis Date Noted  . NSTEMI (non-ST elevated myocardial infarction) ([place]) 12/10/2017    Past Surgical History:  Procedure Laterality Date  . ABDOMINAL HYSTERECTOMY    . BREAST BIOPSY Right 2013   Negative  . LEFT HEART CATH AND CORONARY ANGIOGRAPHY N/A 12/11/2017   Procedure: LEFT HEART CATH AND CORONARY ANGIOGRAPHY;  Surgeon: Isaias Cowman, MD;  Location: [place] [facility];  Service: Cardiovascular;  Laterality: N/A;    Prior to Admission medications   Medication Sig Start Date End Date Taking? Authorizing Provider  aspirin EC 325 MG EC tablet Take 1 tablet (325 mg total) by mouth daily. 12/12/17   Fritzi Mandes, MD  lisinopril (PRINIVIL,ZESTRIL) 2.5 MG tablet Take 1 tablet (2.5 mg total) by mouth at bedtime. 12/12/17   Fritzi Mandes, MD  loratadine (CLARITIN) 10 MG tablet Take 10 mg by mouth daily.     [provider]  metoprolol tartrate (LOPRESSOR) 25 MG tablet Take 0.5 tablets (12.5 mg total) by mouth 2 (two) times daily. 12/12/17   Fritzi Mandes, MD  montelukast (SINGULAIR) 10 MG tablet Take 1 tablet by mouth daily as needed (allergies or asthma).  12/08/16   [provider]  pantoprazole (PROTONIX) 40 MG tablet Take 1 tablet by mouth daily. 12/08/16   [provider]  simvastatin (ZOCOR) 40 MG tablet Take 1 tablet (40 mg total) by mouth daily at 6 PM. 12/12/17   Fritzi Mandes, MD    Allergies  Allergen Reactions  . Penicillins Swelling  . Tetracyclines & Related     Family History  Problem Relation Age of Onset  . Breast cancer Paternal Aunt 75    Social History Social History   Tobacco Use  . Smoking status: Never Smoker  . Smokeless tobacco: Never Used  Substance Use Topics  . Alcohol use: No  . Drug use: Never    Review of Systems Constitutional: Negative for fever. Cardiovascular: Lower chest burning/discomfort, now resolved Respiratory: Negative for shortness of breath. Gastrointestinal: Mild upper abdominal discomfort, resolved.  Negative for diarrhea. Genitourinary: Negative for urinary compaints Musculoskeletal: Negative for leg pain or swelling. Skin: Negative for skin complaints  Neurological: Negative for headache All other ROS negative  ____________________________________________   PHYSICAL EXAM:  VITAL SIGNS: ED Triage Vitals  Enc Vitals Group     BP 02/05/18 0207 (!) 147/78  Pulse Rate 02/05/18 0207 (!) 59     Resp 02/05/18 0207 18     Temp 02/05/18 0207 98.5 F (36.9 C)     Temp Source 02/05/18 0207 Oral     SpO2 02/05/18 0207 99 %     Weight 02/05/18 0208 146 lb (66.2 kg)     Height 02/05/18 0208 5\' 1"  (1.549 m)     Head Circumference --      Peak Flow --      Pain Score 02/05/18 0208 3     Pain Loc --      Pain Edu? --      Excl. in [place]? --    Constitutional: Alert and oriented. Well appearing and in no  distress. Eyes: Normal exam ENT   Head: Normocephalic and atraumatic.   Mouth/Throat: Mucous membranes are moist. Cardiovascular: Normal rate, regular rhythm. No murmur Respiratory: Normal respiratory effort without tachypnea nor retractions. Breath sounds are clear Gastrointestinal: Soft and nontender. No distention.   Musculoskeletal: Nontender with normal range of motion in all extremities. No lower extremity tenderness or edema. Neurologic:  Normal speech and language. No gross focal neurologic deficits Skin:  Skin is warm, dry and intact.  Psychiatric: Mood and affect are normal  ____________________________________________    EKG  EKG reviewed and interpreted by myself shows normal sinus rhythm at 62 bpm, narrow QRS, normal axis, normal intervals, nonspecific ST changes.  Unchanged to improved from prior EKG.  ____________________________________________    RADIOLOGY  Chest x-ray negative  ____________________________________________   INITIAL IMPRESSION / ASSESSMENT AND PLAN / ED COURSE  Pertinent labs & imaging results that were available during my care of the patient were reviewed by me and considered in my medical decision making (see chart for details).  Patient presents to the emergency department for upper abdominal/lower chest discomfort/burning.  Patient believes it is due to her reflux.  Differential would include gastric reflux, ACS, pancreatitis.  Patient states her discomfort is completely resolved.  Patient's labs are normal including negative troponin, chest x-ray and EKG are normal.  I discussed with the patient repeating a troponin as the patient is now been here over 3 hours.  Patient states she has been here all night and she would prefer to go home strongly believes this is due to her reflux.  Patient states she will return if symptoms recur otherwise she will follow-up with her doctor.  ____________________________________________   FINAL  CLINICAL IMPRESSION(S) / ED DIAGNOSES  Chest pain     Harvest Dark, MD 02/05/18 [phone]

## 2018-03-21 ENCOUNTER — Other Ambulatory Visit: Payer: Self-pay | Admitting: Internal Medicine

## 2018-03-21 DIAGNOSIS — Z1231 Encounter for screening mammogram for malignant neoplasm of breast: Secondary | ICD-10-CM

## 2018-04-22 ENCOUNTER — Ambulatory Visit
Admission: RE | Admit: 2018-04-22 | Discharge: 2018-04-22 | Disposition: A | Discharge status: Home / Self Care | Payer: Medicare HMO | Source: Ambulatory Visit | Attending: Internal Medicine | Admitting: Internal Medicine

## 2018-04-22 DIAGNOSIS — Z1231 Encounter for screening mammogram for malignant neoplasm of breast: Secondary | ICD-10-CM | POA: Diagnosis not present

## 2018-05-02 ENCOUNTER — Encounter
Admission: RE | Disposition: A | Discharge status: Home / Self Care | Payer: Self-pay | Source: Ambulatory Visit | Attending: Gastroenterology

## 2018-05-02 ENCOUNTER — Ambulatory Visit: Payer: Medicare HMO | Admitting: Anesthesiology

## 2018-05-02 ENCOUNTER — Ambulatory Visit
Admission: RE | Admit: 2018-05-02 | Discharge: 2018-05-02 | Disposition: A | Discharge status: Home / Self Care | Payer: Medicare HMO | Source: Ambulatory Visit | Attending: Gastroenterology | Admitting: Gastroenterology

## 2018-05-02 DIAGNOSIS — D120 Benign neoplasm of cecum: Secondary | ICD-10-CM | POA: Insufficient documentation

## 2018-05-02 DIAGNOSIS — Z86010 Personal history of colonic polyps: Secondary | ICD-10-CM | POA: Diagnosis present

## 2018-05-02 DIAGNOSIS — K635 Polyp of colon: Secondary | ICD-10-CM | POA: Diagnosis not present

## 2018-05-02 DIAGNOSIS — Z7982 Long term (current) use of aspirin: Secondary | ICD-10-CM | POA: Insufficient documentation

## 2018-05-02 DIAGNOSIS — I10 Essential (primary) hypertension: Secondary | ICD-10-CM | POA: Insufficient documentation

## 2018-05-02 DIAGNOSIS — E785 Hyperlipidemia, unspecified: Secondary | ICD-10-CM | POA: Diagnosis not present

## 2018-05-02 DIAGNOSIS — I2510 Atherosclerotic heart disease of native coronary artery without angina pectoris: Secondary | ICD-10-CM | POA: Insufficient documentation

## 2018-05-02 DIAGNOSIS — K219 Gastro-esophageal reflux disease without esophagitis: Secondary | ICD-10-CM | POA: Insufficient documentation

## 2018-05-02 DIAGNOSIS — Z79899 Other long term (current) drug therapy: Secondary | ICD-10-CM | POA: Insufficient documentation

## 2018-05-02 DIAGNOSIS — D123 Benign neoplasm of transverse colon: Secondary | ICD-10-CM | POA: Insufficient documentation

## 2018-05-02 HISTORY — DX: Ventricular premature depolarization: I49.3

## 2018-05-02 HISTORY — PX: COLONOSCOPY WITH PROPOFOL: SHX5780

## 2018-05-02 HISTORY — DX: Atherosclerotic heart disease of native coronary artery without angina pectoris: I25.10

## 2018-05-02 HISTORY — DX: Hyperlipidemia, unspecified: E78.5

## 2018-05-02 SURGERY — COLONOSCOPY WITH PROPOFOL
Anesthesia: General

## 2018-05-02 MED ORDER — LIDOCAINE HCL (PF) 2 % IJ SOLN
INTRAMUSCULAR | Status: DC | PRN
  Administered 2018-05-02: 60 mg via INTRADERMAL

## 2018-05-02 MED ORDER — PROPOFOL 10 MG/ML IV BOLUS
INTRAVENOUS | Status: AC
  Filled 2018-05-02: qty 20

## 2018-05-02 MED ORDER — PROPOFOL 500 MG/50ML IV EMUL
INTRAVENOUS | Status: DC | PRN
  Administered 2018-05-02: 75 ug/kg/min via INTRAVENOUS

## 2018-05-02 MED ORDER — PROPOFOL 500 MG/50ML IV EMUL
INTRAVENOUS | Status: AC
  Filled 2018-05-02: qty 50

## 2018-05-02 MED ORDER — SODIUM CHLORIDE 0.9 % IV SOLN
INTRAVENOUS | Status: DC
  Administered 2018-05-02: 1000 mL via INTRAVENOUS

## 2018-05-02 MED ORDER — PROPOFOL 10 MG/ML IV BOLUS
INTRAVENOUS | Status: DC | PRN
  Administered 2018-05-02: 50 mg via INTRAVENOUS

## 2018-05-02 NOTE — Anesthesia Post-op Follow-up Note (Signed)
Anesthesia QCDR form completed.        

## 2018-05-02 NOTE — Anesthesia Postprocedure Evaluation (Signed)
Anesthesia Post Note  Patient: Beth Lin  Procedure(s) Performed: COLONOSCOPY WITH PROPOFOL (N/A )  Patient location during evaluation: PACU Anesthesia Type: General Level of consciousness: awake and alert Pain management: pain level controlled Vital Signs Assessment: post-procedure vital signs reviewed and stable Respiratory status: spontaneous breathing, nonlabored ventilation and respiratory function stable Cardiovascular status: blood pressure returned to baseline and stable Postop Assessment: no apparent nausea or vomiting Anesthetic complications: no     Last Vitals:  Vitals:   05/02/18 0820 05/02/18 0830  BP: 128/74 129/61  Pulse: (!) 54 (!) 51  Resp: 17 12  Temp:    SpO2: 100% 100%    Last Pain:  Vitals:   05/02/18 0830  TempSrc:   PainSc: 0-No pain                 Durenda Hurt

## 2018-05-02 NOTE — H&P (Signed)
Outpatient short stay form Pre-procedure 05/02/2018 7:18 AM Beth Sails MD  Primary Physician: [name], MD  Reason for visit: She is a 72 year old female presenting today for colonoscopy.   History of present illness:  She has personal history of adenomatous colon polyps.  She tolerated prep well.  She does take a daily 81 mg aspirin she takes no other aspirin products or blood thinning agent.    Current Facility-Administered Medications:  .  0.9 %  sodium chloride infusion, , Intravenous, Continuous, Beth Sails, MD, Last Rate: 20 mL/hr at 05/02/18 0709, 1,000 mL at 05/02/18 0709  Medications Prior to Admission  Medication Sig Dispense Refill Last Dose  . aspirin EC 325 MG EC tablet Take 1 tablet (325 mg total) by mouth daily. 30 tablet 0 Past Week at Unknown time  . hydrochlorothiazide (HYDRODIURIL) 25 MG tablet Take 25 mg by mouth daily.   Past Week at Unknown time  . loratadine (CLARITIN) 10 MG tablet Take 10 mg by mouth daily.   Past Week at Unknown time  . montelukast (SINGULAIR) 10 MG tablet Take 1 tablet by mouth daily as needed (allergies or asthma).    Past Week at Unknown time  . pantoprazole (PROTONIX) 40 MG tablet Take 1 tablet by mouth daily.   Past Week at Unknown time  . pravastatin (PRAVACHOL) 20 MG tablet Take 20 mg by mouth daily.   Past Week at Unknown time  . simvastatin (ZOCOR) 40 MG tablet Take 1 tablet (40 mg total) by mouth daily at 6 PM. 30 tablet 1 Past Week at Unknown time  . lisinopril (PRINIVIL,ZESTRIL) 2.5 MG tablet Take 1 tablet (2.5 mg total) by mouth at bedtime. (Patient not taking: Reported on 05/01/2018) 30 tablet 1 Not Taking at Unknown time  . metoprolol tartrate (LOPRESSOR) 25 MG tablet Take 0.5 tablets (12.5 mg total) by mouth 2 (two) times daily. (Patient not taking: Reported on 05/01/2018) 60 tablet 1 Not Taking at Unknown time     Allergies  Allergen Reactions  . Lisinopril Cough  . Penicillins Swelling  . Simvastatin    unknown  . Tetracyclines & Related     unknown     Past Medical History:  Diagnosis Date  . Coronary artery disease   . GERD (gastroesophageal reflux disease)   . Hyperlipidemia   . Hypertension   . PVC (premature ventricular contraction)     Review of systems:      Physical Exam    Heart and lungs: Regular rate and rhythm without rub or gallop, lungs are bilaterally clear    HEENT: Cephalic atraumatic eyes are anicteric    Other:    Pertinant exam for procedure: Soft nontender nondistended bowel sounds positive normoactive    Planned proceedures: Colonoscopy and indicated procedures. I have discussed the risks benefits and complications of procedures to include not limited to bleeding, infection, perforation and the risk of sedation and the patient wishes to proceed.    Beth Sails, MD Gastroenterology 05/02/2018  7:18 AM

## 2018-05-02 NOTE — Transfer of Care (Signed)
Immediate Anesthesia Transfer of Care Note  Patient: Beth Lin [place]  Procedure(s) Performed: COLONOSCOPY WITH PROPOFOL (N/A )  Patient Location: PACU  Anesthesia Type:General  Level of Consciousness: awake  Airway & Oxygen Therapy: Patient Spontanous Breathing and Patient connected to nasal cannula oxygen  Post-op Assessment: Report given to RN  Post vital signs: Reviewed  Last Vitals:  Vitals Value Taken Time  BP 119/73 05/02/2018  8:00 AM  Temp 36 C 05/02/2018  8:00 AM  Pulse 68 05/02/2018  8:00 AM  Resp 18 05/02/2018  8:00 AM  SpO2 96 % 05/02/2018  8:00 AM    Last Pain:  Vitals:   05/02/18 0800  TempSrc: Tympanic  PainSc: Asleep         Complications: No apparent anesthesia complications

## 2018-05-02 NOTE — Op Note (Signed)
[facility] Gastroenterology Patient Name: Beth Lin Procedure Date: 05/02/2018 7:06 AM MRN: [id number] Account #: [id number] Date of Birth: [birth year] Admit Type: Outpatient Age: 72 Room: [facility] ENDO ROOM 1 Gender: Female Note Status: Finalized Procedure:            Colonoscopy Indications:          Personal history of colonic polyps Providers:            Lollie Sails, MD Referring MD:         Rusty Aus, MD (Referring MD) Complications:        No immediate complications. Procedure:            Pre-Anesthesia Assessment:                       - ASA Grade Assessment: II - A patient with mild                        systemic disease.                       After obtaining informed consent, the colonoscope was                        passed under direct vision. Throughout the procedure,                        the patient's blood pressure, pulse, and oxygen                        saturations were monitored continuously. The                        Colonoscope was introduced through the anus and                        advanced to the the cecum, identified by appendiceal                        orifice and ileocecal valve. The colonoscopy was                        performed without difficulty. The patient tolerated the                        procedure well. The quality of the bowel preparation                        was good. Findings:      A 2 mm polyp was found in the descending colon. The polyp was sessile.       The polyp was removed with a cold biopsy forceps. Resection and       retrieval were complete.      A 2 mm polyp was found in the transverse colon. The polyp was sessile.       The polyp was removed with a cold biopsy forceps. Resection and       retrieval were complete.      A 3 mm polyp was found in the cecum. The polyp was sessile. The polyp       was removed with a cold biopsy forceps. Resection and  retrieval were       complete.      The  digital rectal exam was normal.      The exam was otherwise without abnormality.      A few small-mouthed diverticula were found in the sigmoid colon. Impression:           - One 2 mm polyp in the descending colon, removed with                        a cold biopsy forceps. Resected and retrieved.                       - One 2 mm polyp in the transverse colon, removed with                        a cold biopsy forceps. Resected and retrieved.                       - One 3 mm polyp in the cecum, removed with a cold                        biopsy forceps. Resected and retrieved.                       - The examination was otherwise normal. Recommendation:       - Advance diet as tolerated. Procedure Code(s):    --- Professional ---                       [phone], Colonoscopy, flexible; with biopsy, single or                        multiple Diagnosis Code(s):    --- Professional ---                       D12.4, Benign neoplasm of descending colon                       D12.3, Benign neoplasm of transverse colon (hepatic                        flexure or splenic flexure)                       D12.0, Benign neoplasm of cecum                       Z86.010, Personal history of colonic polyps CPT copyright 2017 American Medical Association. All rights reserved. The codes documented in this report are preliminary and upon coder review may  be revised to meet current compliance requirements. Lollie Sails, MD 05/02/2018 8:01:36 AM This report has been signed electronically. Number of Addenda: 0 Note Initiated On: 05/02/2018 7:06 AM Scope Withdrawal Time: 0 hours 9 minutes 8 seconds  Total Procedure Duration: 0 hours 21 minutes 49 seconds       [facility]

## 2018-05-02 NOTE — Anesthesia Preprocedure Evaluation (Addendum)
Anesthesia Evaluation  Patient identified by MRN, date of birth, ID band Patient awake    Reviewed: Allergy & Precautions, H&P , NPO status , Patient's Chart, lab work & pertinent test results  Airway Mallampati: III  TM Distance: >3 FB Neck ROM: full    Dental  (+) Upper Dentures, Partial Lower   Pulmonary neg pulmonary ROS,    breath sounds clear to auscultation       Cardiovascular hypertension, + CAD and + Past MI   Rhythm:regular Rate:Normal     Neuro/Psych negative neurological ROS  negative psych ROS   GI/Hepatic Neg liver ROS, GERD  ,  Endo/Other  negative endocrine ROS  Renal/GU negative Renal ROS  negative genitourinary   Musculoskeletal   Abdominal   Peds  Hematology negative hematology ROS (+)   Anesthesia Other Findings Past Medical History: No date: Coronary artery disease No date: GERD (gastroesophageal reflux disease) No date: Hyperlipidemia No date: Hypertension No date: PVC (premature ventricular contraction)  Past Surgical History: No date: ABDOMINAL HYSTERECTOMY 2013: BREAST BIOPSY; Right     Comment:  Negative No date: KNEE ARTHROSCOPY 12/11/2017: LEFT HEART CATH AND CORONARY ANGIOGRAPHY; N/A     Comment:  Procedure: LEFT HEART CATH AND CORONARY ANGIOGRAPHY;                Surgeon: Isaias Cowman, MD;  Location: [place] [facility];  Service: Cardiovascular;  Laterality:               N/A;  BMI    Body Mass Index:  27.59 kg/m      Reproductive/Obstetrics negative OB ROS                            Anesthesia Physical Anesthesia Plan  ASA: II  Anesthesia Plan: General   Post-op Pain Management:    Induction:   PONV Risk Score and Plan: Propofol infusion and TIVA  Airway Management Planned: Natural Airway and Nasal Cannula  Additional Equipment:   Intra-op Plan:   Post-operative Plan:   Informed Consent: I have reviewed  the patients History and Physical, chart, labs and discussed the procedure including the risks, benefits and alternatives for the proposed anesthesia with the patient or authorized representative who has indicated his/her understanding and acceptance.   Dental Advisory Given  Plan Discussed with: Anesthesiologist, CRNA and Surgeon  Anesthesia Plan Comments:       Anesthesia Quick Evaluation

## 2018-05-03 ENCOUNTER — Encounter: Payer: Self-pay | Admitting: Gastroenterology

## 2018-05-04 LAB — SURGICAL PATHOLOGY

## 2019-04-11 ENCOUNTER — Other Ambulatory Visit: Payer: Self-pay | Admitting: Internal Medicine

## 2019-04-11 DIAGNOSIS — Z1231 Encounter for screening mammogram for malignant neoplasm of breast: Secondary | ICD-10-CM

## 2019-05-19 ENCOUNTER — Other Ambulatory Visit: Payer: Self-pay

## 2019-05-19 ENCOUNTER — Ambulatory Visit
Admission: RE | Admit: 2019-05-19 | Discharge: 2019-05-19 | Disposition: A | Discharge status: Home / Self Care | Payer: Medicare Other | Source: Ambulatory Visit | Attending: Internal Medicine | Admitting: Internal Medicine

## 2019-05-19 DIAGNOSIS — Z1231 Encounter for screening mammogram for malignant neoplasm of breast: Secondary | ICD-10-CM | POA: Insufficient documentation

## 2019-05-19 DIAGNOSIS — Z20822 Contact with and (suspected) exposure to covid-19: Secondary | ICD-10-CM

## 2019-05-19 DIAGNOSIS — Z20828 Contact with and (suspected) exposure to other viral communicable diseases: Secondary | ICD-10-CM

## 2019-05-21 LAB — NOVEL CORONAVIRUS, NAA: SARS-CoV-2, NAA: NOT DETECTED

## 2019-05-23 ENCOUNTER — Telehealth: Payer: Self-pay | Admitting: General Practice

## 2019-05-23 NOTE — Telephone Encounter (Signed)
Negative COVID results given. Patient results "NOT Detected." Caller expressed understanding. ° °

## 2020-12-23 ENCOUNTER — Other Ambulatory Visit: Payer: Self-pay | Admitting: Internal Medicine

## 2020-12-23 DIAGNOSIS — Z1231 Encounter for screening mammogram for malignant neoplasm of breast: Secondary | ICD-10-CM

## 2021-01-11 ENCOUNTER — Ambulatory Visit
Admission: RE | Admit: 2021-01-11 | Discharge: 2021-01-11 | Disposition: A | Discharge status: Home / Self Care | Payer: Medicare Other | Source: Ambulatory Visit | Attending: Internal Medicine | Admitting: Internal Medicine

## 2021-01-11 ENCOUNTER — Other Ambulatory Visit: Payer: Self-pay

## 2021-01-11 DIAGNOSIS — Z1231 Encounter for screening mammogram for malignant neoplasm of breast: Secondary | ICD-10-CM | POA: Diagnosis not present

## 2021-01-17 ENCOUNTER — Other Ambulatory Visit: Payer: Self-pay | Admitting: Internal Medicine

## 2021-01-17 DIAGNOSIS — R928 Other abnormal and inconclusive findings on diagnostic imaging of breast: Secondary | ICD-10-CM

## 2021-01-17 DIAGNOSIS — N6489 Other specified disorders of breast: Secondary | ICD-10-CM

## 2021-01-20 ENCOUNTER — Ambulatory Visit
Admission: RE | Admit: 2021-01-20 | Discharge: 2021-01-20 | Disposition: A | Discharge status: Home / Self Care | Payer: Medicare Other | Source: Ambulatory Visit | Attending: Internal Medicine | Admitting: Internal Medicine

## 2021-01-20 ENCOUNTER — Other Ambulatory Visit: Payer: Self-pay

## 2021-01-20 DIAGNOSIS — R928 Other abnormal and inconclusive findings on diagnostic imaging of breast: Secondary | ICD-10-CM

## 2021-01-20 DIAGNOSIS — N6489 Other specified disorders of breast: Secondary | ICD-10-CM

## 2022-03-02 IMAGING — US US BREAST*R* LIMITED INC AXILLA
1 series · 7 of 7 positions shown · non-contrast
Comparison: Previous exam(s).

CLINICAL DATA: Callback for RIGHT breast focal asymmetry

EXAM:
DIGITAL DIAGNOSTIC UNILATERAL RIGHT MAMMOGRAM WITH TOMOSYNTHESIS AND
CAD; ULTRASOUND RIGHT BREAST LIMITED
TECHNIQUE: Right digital diagnostic mammography and breast tomosynthesis was
performed. The images were evaluated with computer-aided detection.;
Targeted ultrasound examination of the right breast was performed

[Series 1: us breast*right* limited inc axilla · 0.07mm/px · 7 of 7 slices shown]
[im 1/7]
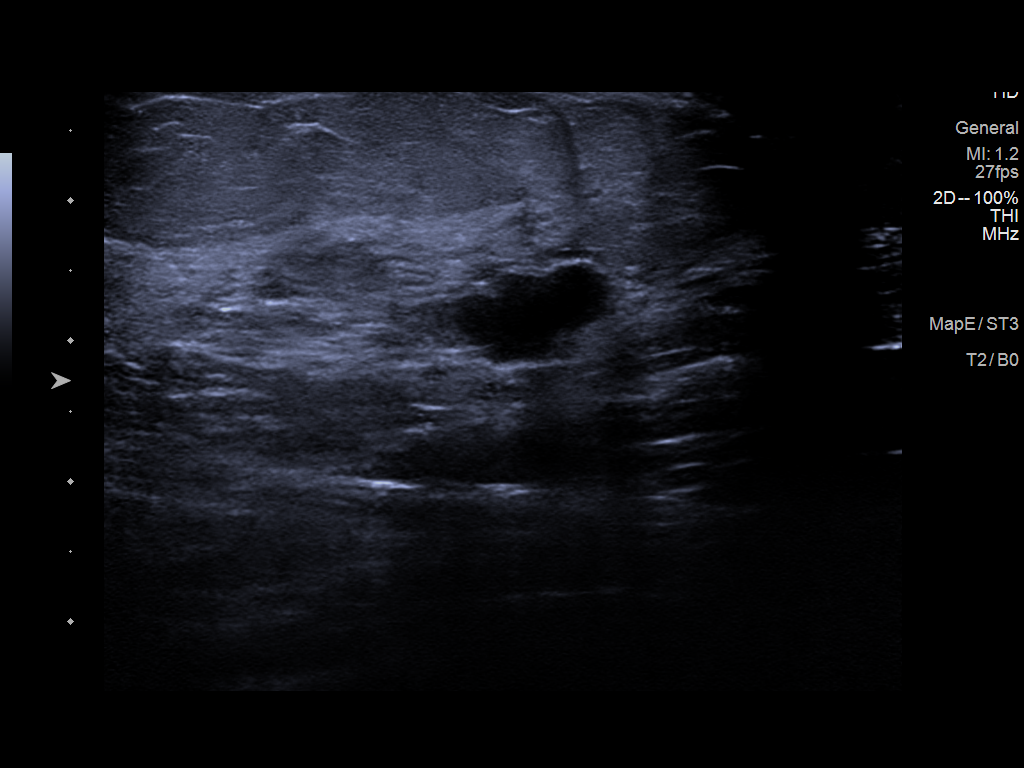
[im 2/7]
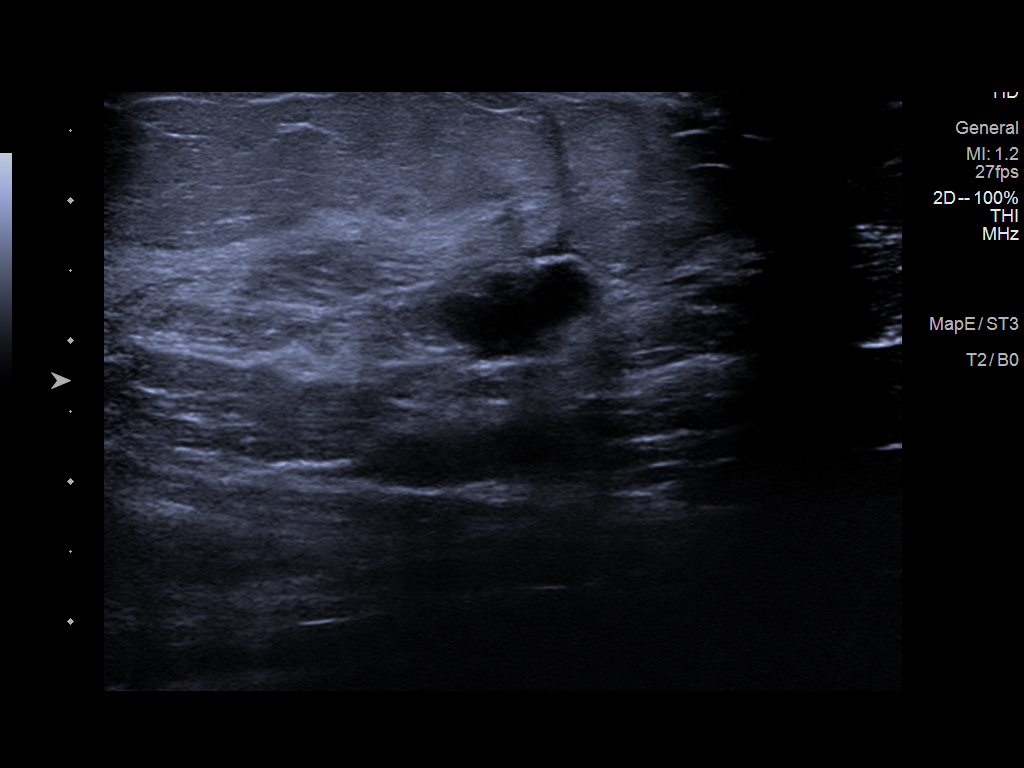
[im 3/7]
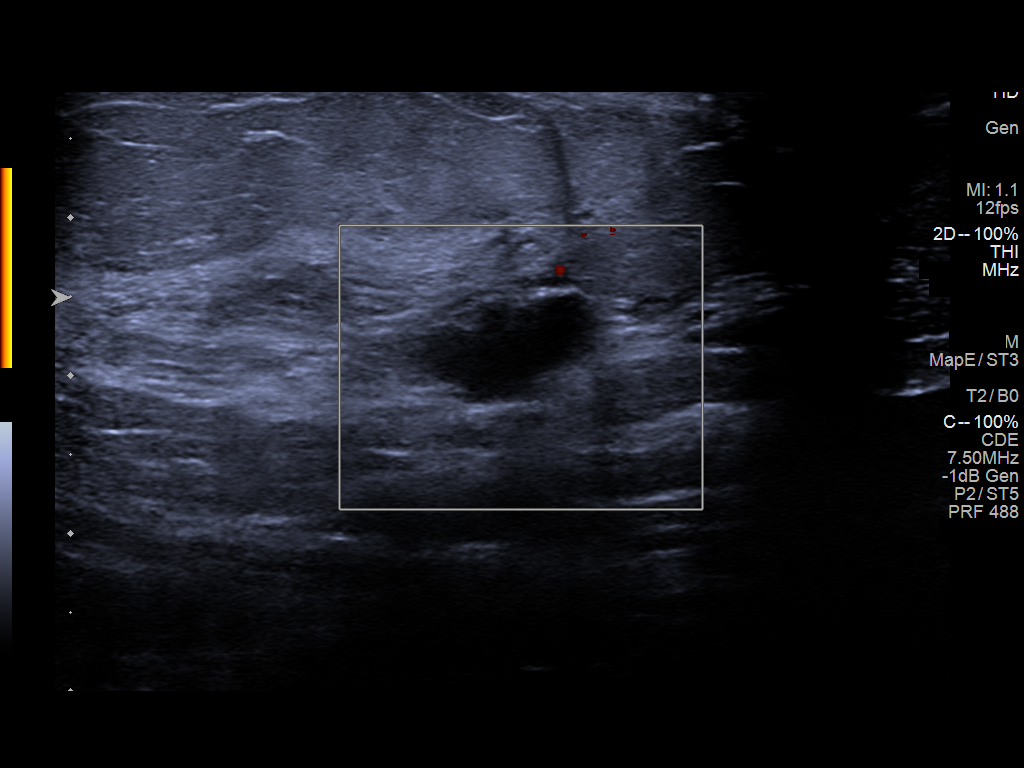
[im 4/7]
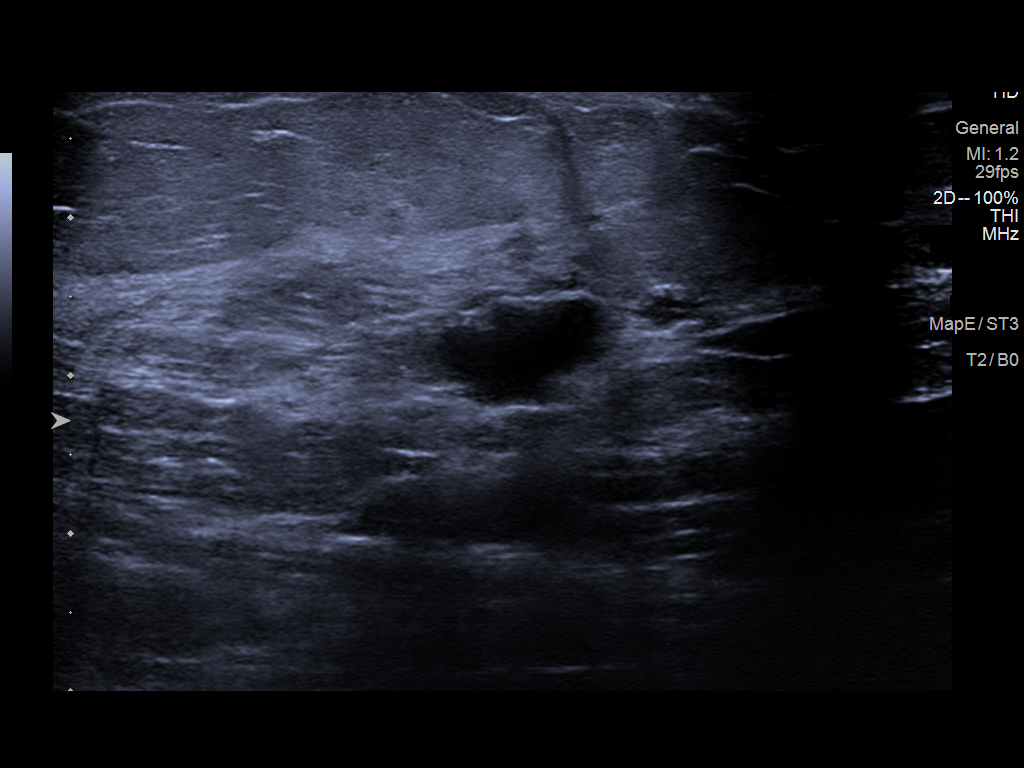
[im 5/7]
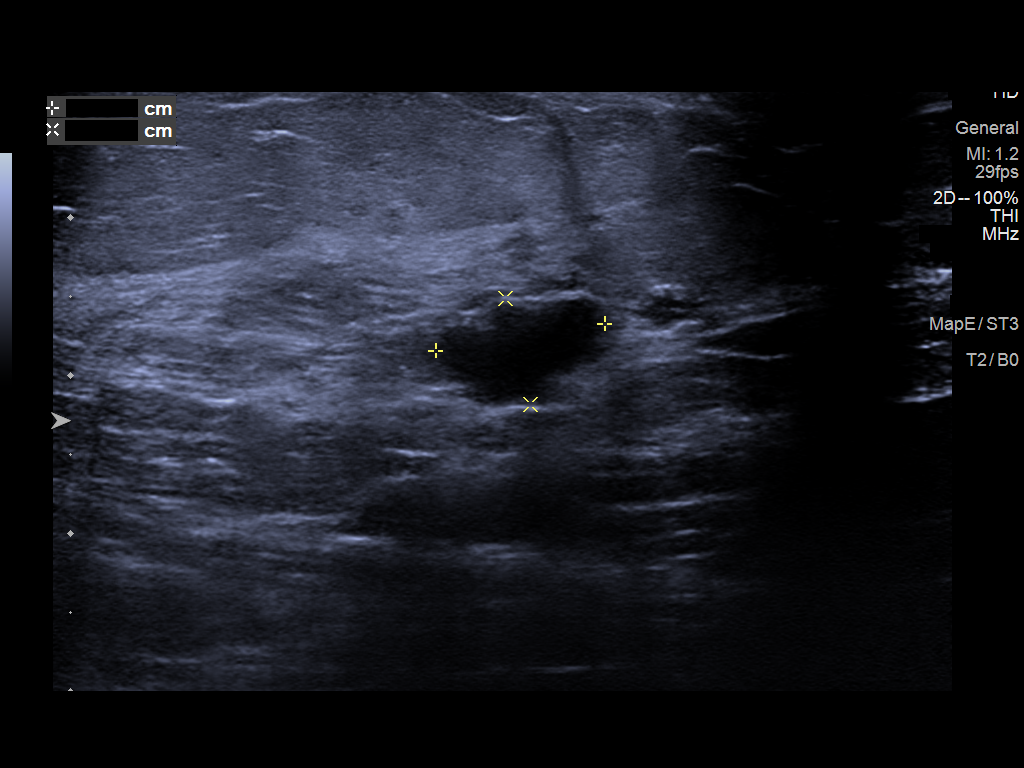
[im 6/7]
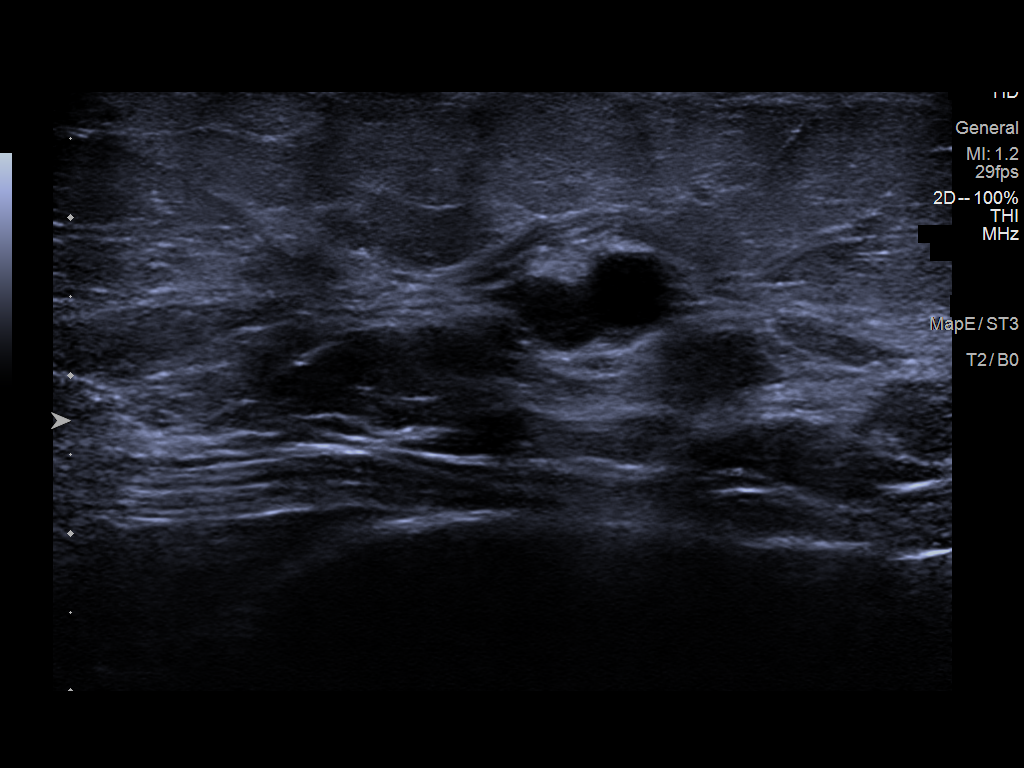
[im 7/7]
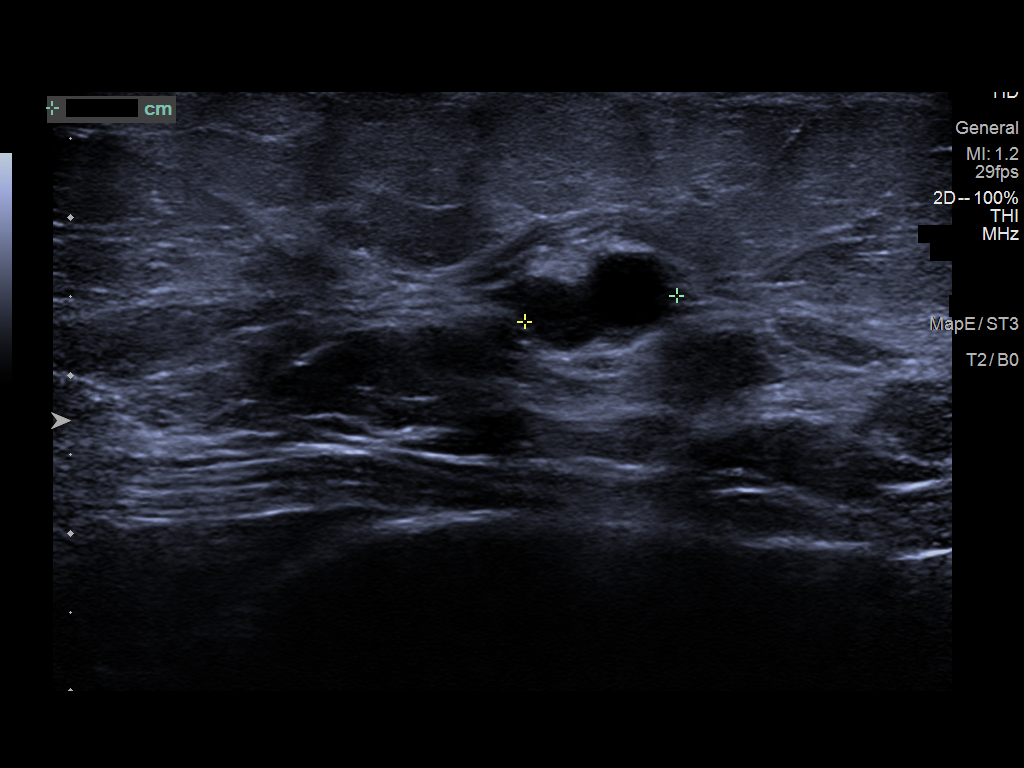

[7 of 7 positions shown; findings below may reference images not displayed]

ACR Breast Density Category b: There are scattered areas of
fibroglandular density.
FINDINGS: Spot compression tomosynthesis views confirm persistence of an oval
circumscribed mass in the central breast at middle depth. It
measures approximately 12 mm.

On physical exam, no suspicious mass is appreciated.

Targeted ultrasound was performed of the retroareolar breast. In the
central breast, there is an oval circumscribed anechoic mass with
posterior acoustic enhancement and suggestion of several thin
internal septations on real-time examination. It measures 11 x 7 x
10 mm and is consistent with a benign cluster of cysts.
IMPRESSION: There is a benign cluster of cysts at the site of screening
mammographic concern.

RECOMMENDATION:
Screening mammogram in one year.(Code:WV-1-IH4)

I have discussed the findings and recommendations with the patient.
If applicable, a reminder letter will be sent to the patient
regarding the next appointment.

BI-RADS CATEGORY  2: Benign.

## 2022-08-29 ENCOUNTER — Other Ambulatory Visit: Payer: Self-pay | Admitting: Internal Medicine

## 2022-08-29 DIAGNOSIS — Z1231 Encounter for screening mammogram for malignant neoplasm of breast: Secondary | ICD-10-CM

## 2022-09-20 ENCOUNTER — Ambulatory Visit
Admission: RE | Admit: 2022-09-20 | Discharge: 2022-09-20 | Disposition: A | Discharge status: Home / Self Care | Payer: Medicare HMO | Source: Ambulatory Visit | Attending: Internal Medicine | Admitting: Internal Medicine

## 2022-09-20 DIAGNOSIS — Z1231 Encounter for screening mammogram for malignant neoplasm of breast: Secondary | ICD-10-CM | POA: Insufficient documentation

## 2023-11-08 ENCOUNTER — Encounter: Payer: Self-pay | Admitting: Internal Medicine

## 2023-12-04 DIAGNOSIS — Z6825 Body mass index (BMI) 25.0-25.9, adult: Secondary | ICD-10-CM | POA: Diagnosis not present

## 2023-12-04 DIAGNOSIS — J4530 Mild persistent asthma, uncomplicated: Secondary | ICD-10-CM | POA: Diagnosis not present

## 2023-12-04 DIAGNOSIS — I129 Hypertensive chronic kidney disease with stage 1 through stage 4 chronic kidney disease, or unspecified chronic kidney disease: Secondary | ICD-10-CM | POA: Diagnosis not present

## 2023-12-04 DIAGNOSIS — N1831 Chronic kidney disease, stage 3a: Secondary | ICD-10-CM | POA: Diagnosis not present

## 2023-12-04 DIAGNOSIS — E663 Overweight: Secondary | ICD-10-CM | POA: Diagnosis not present

## 2023-12-04 DIAGNOSIS — I2510 Atherosclerotic heart disease of native coronary artery without angina pectoris: Secondary | ICD-10-CM | POA: Diagnosis not present

## 2023-12-04 DIAGNOSIS — Z008 Encounter for other general examination: Secondary | ICD-10-CM | POA: Diagnosis not present

## 2023-12-04 DIAGNOSIS — E785 Hyperlipidemia, unspecified: Secondary | ICD-10-CM | POA: Diagnosis not present

## 2023-12-20 DIAGNOSIS — I2510 Atherosclerotic heart disease of native coronary artery without angina pectoris: Secondary | ICD-10-CM | POA: Diagnosis not present

## 2023-12-20 DIAGNOSIS — E782 Mixed hyperlipidemia: Secondary | ICD-10-CM | POA: Diagnosis not present

## 2023-12-20 DIAGNOSIS — I10 Essential (primary) hypertension: Secondary | ICD-10-CM | POA: Diagnosis not present

## 2023-12-20 DIAGNOSIS — I493 Ventricular premature depolarization: Secondary | ICD-10-CM | POA: Diagnosis not present

## 2023-12-20 DIAGNOSIS — M791 Myalgia, unspecified site: Secondary | ICD-10-CM | POA: Diagnosis not present

## 2023-12-20 DIAGNOSIS — Z789 Other specified health status: Secondary | ICD-10-CM | POA: Diagnosis not present

## 2023-12-20 DIAGNOSIS — T466X5A Adverse effect of antihyperlipidemic and antiarteriosclerotic drugs, initial encounter: Secondary | ICD-10-CM | POA: Diagnosis not present

## 2023-12-24 DIAGNOSIS — E782 Mixed hyperlipidemia: Secondary | ICD-10-CM | POA: Diagnosis not present

## 2023-12-24 DIAGNOSIS — I2510 Atherosclerotic heart disease of native coronary artery without angina pectoris: Secondary | ICD-10-CM | POA: Diagnosis not present

## 2023-12-24 DIAGNOSIS — E559 Vitamin D deficiency, unspecified: Secondary | ICD-10-CM | POA: Diagnosis not present

## 2023-12-24 DIAGNOSIS — R739 Hyperglycemia, unspecified: Secondary | ICD-10-CM | POA: Diagnosis not present

## 2023-12-31 DIAGNOSIS — Z1331 Encounter for screening for depression: Secondary | ICD-10-CM | POA: Diagnosis not present

## 2023-12-31 DIAGNOSIS — Z789 Other specified health status: Secondary | ICD-10-CM | POA: Diagnosis not present

## 2023-12-31 DIAGNOSIS — M791 Myalgia, unspecified site: Secondary | ICD-10-CM | POA: Diagnosis not present

## 2023-12-31 DIAGNOSIS — Z0000 Encounter for general adult medical examination without abnormal findings: Secondary | ICD-10-CM | POA: Diagnosis not present

## 2023-12-31 DIAGNOSIS — M818 Other osteoporosis without current pathological fracture: Secondary | ICD-10-CM | POA: Diagnosis not present

## 2023-12-31 DIAGNOSIS — T466X5A Adverse effect of antihyperlipidemic and antiarteriosclerotic drugs, initial encounter: Secondary | ICD-10-CM | POA: Diagnosis not present

## 2023-12-31 DIAGNOSIS — R739 Hyperglycemia, unspecified: Secondary | ICD-10-CM | POA: Diagnosis not present

## 2023-12-31 DIAGNOSIS — I2510 Atherosclerotic heart disease of native coronary artery without angina pectoris: Secondary | ICD-10-CM | POA: Diagnosis not present

## 2023-12-31 DIAGNOSIS — E782 Mixed hyperlipidemia: Secondary | ICD-10-CM | POA: Diagnosis not present

## 2024-01-31 DIAGNOSIS — Z860101 Personal history of adenomatous and serrated colon polyps: Secondary | ICD-10-CM | POA: Diagnosis not present

## 2024-01-31 DIAGNOSIS — K219 Gastro-esophageal reflux disease without esophagitis: Secondary | ICD-10-CM | POA: Diagnosis not present

## 2024-03-18 ENCOUNTER — Other Ambulatory Visit: Payer: Self-pay | Admitting: Internal Medicine

## 2024-03-18 DIAGNOSIS — Z1231 Encounter for screening mammogram for malignant neoplasm of breast: Secondary | ICD-10-CM

## 2024-03-31 ENCOUNTER — Ambulatory Visit
Admission: RE | Admit: 2024-03-31 | Discharge: 2024-03-31 | Disposition: A | Discharge status: Home / Self Care | Source: Ambulatory Visit | Attending: Internal Medicine | Admitting: Internal Medicine

## 2024-03-31 DIAGNOSIS — E782 Mixed hyperlipidemia: Secondary | ICD-10-CM | POA: Diagnosis not present

## 2024-03-31 DIAGNOSIS — Z1231 Encounter for screening mammogram for malignant neoplasm of breast: Secondary | ICD-10-CM | POA: Diagnosis not present

## 2024-04-11 ENCOUNTER — Ambulatory Visit: Admitting: Anesthesiology

## 2024-04-11 ENCOUNTER — Ambulatory Visit
Admission: RE | Admit: 2024-04-11 | Discharge: 2024-04-11 | Disposition: A | Discharge status: Home / Self Care | Attending: Gastroenterology | Admitting: Gastroenterology

## 2024-04-11 ENCOUNTER — Encounter
Admission: RE | Disposition: A | Discharge status: Home / Self Care | Payer: Self-pay | Source: Home / Self Care | Attending: Gastroenterology

## 2024-04-11 ENCOUNTER — Encounter: Payer: Self-pay | Admitting: Gastroenterology

## 2024-04-11 DIAGNOSIS — I10 Essential (primary) hypertension: Secondary | ICD-10-CM | POA: Diagnosis not present

## 2024-04-11 DIAGNOSIS — Z90710 Acquired absence of both cervix and uterus: Secondary | ICD-10-CM | POA: Diagnosis not present

## 2024-04-11 DIAGNOSIS — Z7982 Long term (current) use of aspirin: Secondary | ICD-10-CM | POA: Insufficient documentation

## 2024-04-11 DIAGNOSIS — K635 Polyp of colon: Secondary | ICD-10-CM | POA: Diagnosis not present

## 2024-04-11 DIAGNOSIS — Z1211 Encounter for screening for malignant neoplasm of colon: Secondary | ICD-10-CM | POA: Diagnosis not present

## 2024-04-11 DIAGNOSIS — K219 Gastro-esophageal reflux disease without esophagitis: Secondary | ICD-10-CM | POA: Insufficient documentation

## 2024-04-11 DIAGNOSIS — K640 First degree hemorrhoids: Secondary | ICD-10-CM | POA: Insufficient documentation

## 2024-04-11 DIAGNOSIS — Z860101 Personal history of adenomatous and serrated colon polyps: Secondary | ICD-10-CM | POA: Diagnosis not present

## 2024-04-11 DIAGNOSIS — D120 Benign neoplasm of cecum: Secondary | ICD-10-CM | POA: Diagnosis not present

## 2024-04-11 HISTORY — PX: POLYPECTOMY: SHX149

## 2024-04-11 HISTORY — PX: COLONOSCOPY: SHX5424

## 2024-04-11 SURGERY — COLONOSCOPY
Anesthesia: General

## 2024-04-11 MED ORDER — PROPOFOL 10 MG/ML IV BOLUS
INTRAVENOUS | Status: DC | PRN
  Administered 2024-04-11: 60 mg via INTRAVENOUS

## 2024-04-11 MED ORDER — SODIUM CHLORIDE 0.9 % IV SOLN
INTRAVENOUS | Status: DC
  Administered 2024-04-11: 20 mL/h via INTRAVENOUS

## 2024-04-11 MED ORDER — PROPOFOL 500 MG/50ML IV EMUL
INTRAVENOUS | Status: DC | PRN
  Administered 2024-04-11: 140 ug/kg/min via INTRAVENOUS

## 2024-04-11 NOTE — H&P (Signed)
 Outpatient short stay form Pre-procedure 04/11/2024  Ole [name], MD  Primary Physician: Cleotilde Oneil FALCON, MD  Reason for visit:  Surveillance  History of present illness:    78 y/o lady with history of hypertension, HLD, and CAD here for surveillance colonoscopy. No family history of GI malignancies. Last colonoscopy in 2019. No blood thinners besides aspirin . History of hysterectomy.    Current Facility-Administered Medications:    0.9 %  sodium chloride  infusion, , Intravenous, Continuous, Jeryn Cerney, Ole ONEIDA, MD, Last Rate: 20 mL/hr at 04/11/24 1043, 20 mL/hr at 04/11/24 1043  Medications Prior to Admission  Medication Sig Dispense Refill Last Dose/Taking   aspirin  EC 325 MG EC tablet Take 1 tablet (325 mg total) by mouth daily. 30 tablet 0 Past Week   hydrochlorothiazide (HYDRODIURIL) 25 MG tablet Take 25 mg by mouth daily.   Past Week   loratadine (CLARITIN) 10 MG tablet Take 10 mg by mouth daily.   Past Week   montelukast (SINGULAIR) 10 MG tablet Take 1 tablet by mouth daily as needed (allergies or asthma).    Past Week   pantoprazole  (PROTONIX ) 40 MG tablet Take 1 tablet by mouth daily.   Past Week   pravastatin (PRAVACHOL) 20 MG tablet Take 20 mg by mouth daily.   Past Week   simvastatin  (ZOCOR ) 40 MG tablet Take 1 tablet (40 mg total) by mouth daily at 6 PM. 30 tablet 1 Past Week   lisinopril  (PRINIVIL ,ZESTRIL ) 2.5 MG tablet Take 1 tablet (2.5 mg total) by mouth at bedtime. (Patient not taking: Reported on 05/01/2018) 30 tablet 1    metoprolol  tartrate (LOPRESSOR ) 25 MG tablet Take 0.5 tablets (12.5 mg total) by mouth 2 (two) times daily. (Patient not taking: Reported on 05/01/2018) 60 tablet 1      Allergies  Allergen Reactions   Lisinopril  Cough   Penicillins Swelling   Simvastatin      unknown   Tetracyclines & Related     unknown     Past Medical History:  Diagnosis Date   Coronary artery disease    GERD (gastroesophageal reflux disease)    Hyperlipidemia     Hypertension    PVC (premature ventricular contraction)     Review of systems:  Otherwise negative.    Physical Exam  Gen: Alert, oriented. Appears stated age.  HEENT: PERRLA. Lungs: No respiratory distress CV: RRR Abd: soft, benign, no masses Ext: No edema    Planned procedures: Proceed with colonoscopy. The patient understands the nature of the planned procedure, indications, risks, alternatives and potential complications including but not limited to bleeding, infection, perforation, damage to internal organs and possible oversedation/side effects from anesthesia. The patient agrees and gives consent to proceed.  Please refer to procedure notes for findings, recommendations and patient disposition/instructions.     Ole [name], MD [facility]

## 2024-04-11 NOTE — Transfer of Care (Signed)
 Immediate Anesthesia Transfer of Care Note  Patient: Beth Lin [place]  Procedure(s) Performed: COLONOSCOPY POLYPECTOMY, INTESTINE  Patient Location: PACU  Anesthesia Type:General  Level of Consciousness: awake, alert , and oriented  Airway & Oxygen Therapy: Patient Spontanous Breathing  Post-op Assessment: Report given to RN and Post -op Vital signs reviewed and stable  Post vital signs: Reviewed and stable  Last Vitals:  Vitals Value Taken Time  BP    Temp    Pulse    Resp    SpO2      Last Pain:  Vitals:   04/11/24 1031  TempSrc: Temporal  PainSc: 1          Complications: No notable events documented.

## 2024-04-11 NOTE — Interval H&P Note (Signed)
 History and Physical Interval Note:  04/11/2024 11:26 AM  Beth Lin  has presented today for surgery, with the diagnosis of HX OF ADENOMATOUS POLYP OF COLON.  The various methods of treatment have been discussed with the patient and family. After consideration of risks, benefits and other options for treatment, the patient has consented to  Procedure(s): COLONOSCOPY (N/A) as a surgical intervention.  The patient's history has been reviewed, patient examined, no change in status, stable for surgery.  I have reviewed the patient's chart and labs.  Questions were answered to the patient's satisfaction.     Ole [name]  Ok to proceed with colonoscopy

## 2024-04-11 NOTE — Anesthesia Preprocedure Evaluation (Addendum)
 Anesthesia Evaluation  Patient identified by MRN, date of birth, ID band Patient awake    Reviewed: Allergy & Precautions, NPO status , Patient's Chart, lab work & pertinent test results  Airway Mallampati: II  TM Distance: >3 FB     Dental  (+) Upper Dentures, Lower Dentures   Pulmonary neg pulmonary ROS   breath sounds clear to auscultation       Cardiovascular Exercise Tolerance: Good hypertension, Pt. on medications  Rhythm:Regular Rate:Normal     Neuro/Psych negative neurological ROS  negative psych ROS   GI/Hepatic Neg liver ROS,GERD  Medicated,,  Endo/Other  negative endocrine ROS    Renal/GU negative Renal ROS  negative genitourinary   Musculoskeletal negative musculoskeletal ROS (+)    Abdominal   Peds negative pediatric ROS (+)  Hematology negative hematology ROS (+)   Anesthesia Other Findings   Reproductive/Obstetrics                              Anesthesia Physical Anesthesia Plan  ASA: 2  Anesthesia Plan: General   Post-op Pain Management:    Induction: Intravenous  PONV Risk Score and Plan:   Airway Management Planned: Natural Airway and Nasal Cannula  Additional Equipment:   Intra-op Plan:   Post-operative Plan:   Informed Consent: I have reviewed the patients History and Physical, chart, labs and discussed the procedure including the risks, benefits and alternatives for the proposed anesthesia with the patient or authorized representative who has indicated his/her understanding and acceptance.       Plan Discussed with: CRNA  Anesthesia Plan Comments:         Anesthesia Quick Evaluation

## 2024-04-11 NOTE — Op Note (Signed)
 [facility] Patient Name: Beth Lin Procedure Date: 04/11/2024 11:28 AM MRN: [id number] Account #: [id number] Date of Birth: 04/21/1946 Admit Type: Outpatient Age: 78 Room: [facility] ENDO ROOM 3 Gender: Female Note Status: Finalized Instrument Name: Colon Scope [phone] Procedure:             Colonoscopy Indications:           Surveillance: Personal history of adenomatous polyps                         on last colonoscopy > 5 years ago Providers:             Ole Schick MD, MD Referring MD:          Oneil PHEBE Pinal, MD (Referring MD) Medicines:             Monitored Anesthesia Care Complications:         No immediate complications. Procedure:             Pre-Anesthesia Assessment:                        - Prior to the procedure, a History and Physical was                         performed, and patient medications and allergies were                         reviewed. The patient is competent. The risks and                         benefits of the procedure and the sedation options and                         risks were discussed with the patient. All questions                         were answered and informed consent was obtained.                         Patient identification and proposed procedure were                         verified by the physician, the nurse, the                         anesthesiologist, the anesthetist and the technician                         in the endoscopy suite. Mental Status Examination:                         alert and oriented. Airway Examination: normal                         oropharyngeal airway and neck mobility. Respiratory                         Examination: clear to auscultation. CV Examination:  normal. Prophylactic Antibiotics: The patient does not                         require prophylactic antibiotics. Prior                         Anticoagulants: The patient has taken no  anticoagulant                         or antiplatelet agents. ASA Grade Assessment: II - A                         patient with mild systemic disease. After reviewing                         the risks and benefits, the patient was deemed in                         satisfactory condition to undergo the procedure. The                         anesthesia plan was to use monitored anesthesia care                         (MAC). Immediately prior to administration of                         medications, the patient was re-assessed for adequacy                         to receive sedatives. The heart rate, respiratory                         rate, oxygen saturations, blood pressure, adequacy of                         pulmonary ventilation, and response to care were                         monitored throughout the procedure. The physical                         status of the patient was re-assessed after the                         procedure.                        After obtaining informed consent, the colonoscope was                         passed under direct vision. Throughout the procedure,                         the patient's blood pressure, pulse, and oxygen                         saturations were monitored continuously. The  Colonoscope was introduced through the anus and                         advanced to the the terminal ileum, with                         identification of the appendiceal orifice and IC                         valve. The colonoscopy was performed without                         difficulty. The patient tolerated the procedure well.                         The quality of the bowel preparation was good. The                         terminal ileum, ileocecal valve, appendiceal orifice,                         and rectum were photographed. Findings:      The perianal and digital rectal examinations were normal.      A 1 mm polyp was found in the  cecum. The polyp was sessile. The polyp       was removed with a jumbo cold forceps. Resection and retrieval were       complete. Estimated blood loss was minimal.      Internal hemorrhoids were found during endoscopy. The hemorrhoids were       Grade I (internal hemorrhoids that do not prolapse).      Retroflexion in the rectum was not performed.      The exam was otherwise without abnormality. Impression:            - One 1 mm polyp in the cecum, removed with a jumbo                         cold forceps. Resected and retrieved.                        - Internal hemorrhoids.                        - The examination was otherwise normal. Recommendation:        - Discharge patient to home.                        - Resume previous diet.                        - Continue present medications.                        - Await pathology results.                        - Repeat colonoscopy is not recommended due to current                         age (66 years or older) for  surveillance.                        - Return to referring physician as previously                         scheduled. Procedure Code(s):     --- Professional ---                        [phone], Colonoscopy, flexible; with biopsy, single or                         multiple Diagnosis Code(s):     --- Professional ---                        Z86.010, Personal history of colonic polyps                        D12.0, Benign neoplasm of cecum                        K64.0, First degree hemorrhoids CPT copyright 2022 [employer]. All rights reserved. The codes documented in this report are preliminary and upon coder review may  be revised to meet current compliance requirements. Ole Schick MD, MD 04/11/2024 11:51:01 AM Number of Addenda: 0 Note Initiated On: 04/11/2024 11:28 AM Scope Withdrawal Time: 0 hours 6 minutes 32 seconds  Total Procedure Duration: 0 hours 10 minutes 43 seconds  Estimated Blood Loss:   Estimated blood loss was minimal.      [facility]

## 2024-04-11 NOTE — Anesthesia Postprocedure Evaluation (Signed)
 Anesthesia Post Note  Patient: Beth Lin [place]  Procedure(s) Performed: COLONOSCOPY POLYPECTOMY, INTESTINE  Patient location during evaluation: PACU Anesthesia Type: General Level of consciousness: awake Pain management: satisfactory to patient Vital Signs Assessment: post-procedure vital signs reviewed and stable Respiratory status: spontaneous breathing Cardiovascular status: stable Anesthetic complications: no   No notable events documented.   Last Vitals:  Vitals:   04/11/24 1158 04/11/24 1207  BP: 136/82 134/83  Pulse: 62 (!) 59  Resp: 18 18  Temp:    SpO2: 100% 100%    Last Pain:  Vitals:   04/11/24 1207  TempSrc:   PainSc: 0-No pain                 VAN STAVEREN,Alicha Raspberry

## 2024-04-15 LAB — SURGICAL PATHOLOGY

## 2024-09-09 ENCOUNTER — Other Ambulatory Visit: Payer: Self-pay
